# Patient Record
Sex: Female | Born: 1955
Health system: Southern US, Community
[De-identification: ages and names within clinical notes are randomized; demographics above are authoritative.]

## PROBLEM LIST (undated history)

## (undated) ENCOUNTER — Ambulatory Visit

## (undated) DIAGNOSIS — E785 Hyperlipidemia, unspecified: Secondary | ICD-10-CM

## (undated) DIAGNOSIS — K219 Gastro-esophageal reflux disease without esophagitis: Secondary | ICD-10-CM

## (undated) DIAGNOSIS — F419 Anxiety disorder, unspecified: Secondary | ICD-10-CM

## (undated) DIAGNOSIS — T7840XA Allergy, unspecified, initial encounter: Secondary | ICD-10-CM

## (undated) DIAGNOSIS — Q8789 Other specified congenital malformation syndromes, not elsewhere classified: Secondary | ICD-10-CM

## (undated) DIAGNOSIS — I10 Essential (primary) hypertension: Secondary | ICD-10-CM

## (undated) DIAGNOSIS — F32A Depression, unspecified: Secondary | ICD-10-CM

## (undated) DIAGNOSIS — N289 Disorder of kidney and ureter, unspecified: Secondary | ICD-10-CM

## (undated) DIAGNOSIS — M1990 Unspecified osteoarthritis, unspecified site: Secondary | ICD-10-CM

## (undated) DIAGNOSIS — F329 Major depressive disorder, single episode, unspecified: Secondary | ICD-10-CM

## (undated) HISTORY — DX: Hyperlipidemia, unspecified: E78.5

## (undated) HISTORY — PX: APPENDECTOMY: SHX54

## (undated) HISTORY — PX: URETERAL STENT PLACEMENT: SHX822

## (undated) HISTORY — PX: ABDOMINAL HYSTERECTOMY: SHX81

## (undated) HISTORY — DX: Unspecified osteoarthritis, unspecified site: M19.90

## (undated) HISTORY — DX: Essential (primary) hypertension: I10

## (undated) HISTORY — DX: Allergy, unspecified, initial encounter: T78.40XA

## (undated) HISTORY — PX: FOOT SURGERY: SHX648

---

## 1997-12-31 ENCOUNTER — Inpatient Hospital Stay (HOSPITAL_COMMUNITY): Admission: EM | Admit: 1997-12-31 | Discharge: 1998-01-06 | Payer: Self-pay | Admitting: *Deleted

## 1998-01-12 ENCOUNTER — Inpatient Hospital Stay (HOSPITAL_COMMUNITY): Admission: EM | Admit: 1998-01-12 | Discharge: 1998-01-20 | Payer: Self-pay | Admitting: Emergency Medicine

## 1998-01-12 ENCOUNTER — Ambulatory Visit: Admission: RE | Admit: 1998-01-12 | Discharge: 1998-01-12 | Payer: Self-pay | Admitting: Surgery

## 1998-01-12 ENCOUNTER — Encounter: Payer: Self-pay | Admitting: Surgery

## 1998-01-13 ENCOUNTER — Encounter: Payer: Self-pay | Admitting: Surgery

## 1998-01-15 ENCOUNTER — Encounter: Payer: Self-pay | Admitting: Surgery

## 1998-01-19 ENCOUNTER — Encounter: Payer: Self-pay | Admitting: Surgery

## 1999-05-16 ENCOUNTER — Emergency Department (HOSPITAL_COMMUNITY): Admission: EM | Admit: 1999-05-16 | Discharge: 1999-05-16 | Payer: Self-pay | Admitting: Emergency Medicine

## 1999-05-26 ENCOUNTER — Encounter: Admission: RE | Admit: 1999-05-26 | Discharge: 1999-05-26 | Payer: Self-pay | Admitting: Family Medicine

## 1999-05-26 ENCOUNTER — Encounter: Payer: Self-pay | Admitting: Family Medicine

## 1999-06-25 ENCOUNTER — Encounter (INDEPENDENT_AMBULATORY_CARE_PROVIDER_SITE_OTHER): Payer: Self-pay | Admitting: Specialist

## 1999-06-25 ENCOUNTER — Other Ambulatory Visit: Admission: RE | Admit: 1999-06-25 | Discharge: 1999-06-25 | Payer: Self-pay | Admitting: Otolaryngology

## 2002-07-27 ENCOUNTER — Emergency Department (HOSPITAL_COMMUNITY): Admission: EM | Admit: 2002-07-27 | Discharge: 2002-07-27 | Payer: Self-pay | Admitting: Emergency Medicine

## 2002-07-27 ENCOUNTER — Encounter: Payer: Self-pay | Admitting: Internal Medicine

## 2002-07-29 ENCOUNTER — Ambulatory Visit (HOSPITAL_COMMUNITY): Admission: RE | Admit: 2002-07-29 | Discharge: 2002-07-29 | Payer: Self-pay | Admitting: Internal Medicine

## 2002-07-29 ENCOUNTER — Encounter: Payer: Self-pay | Admitting: Internal Medicine

## 2002-08-26 ENCOUNTER — Inpatient Hospital Stay (HOSPITAL_COMMUNITY): Admission: RE | Admit: 2002-08-26 | Discharge: 2002-08-28 | Payer: Self-pay | Admitting: Obstetrics and Gynecology

## 2002-08-26 ENCOUNTER — Encounter (INDEPENDENT_AMBULATORY_CARE_PROVIDER_SITE_OTHER): Payer: Self-pay

## 2003-01-23 ENCOUNTER — Emergency Department (HOSPITAL_COMMUNITY): Admission: EM | Admit: 2003-01-23 | Discharge: 2003-01-23 | Payer: Self-pay | Admitting: Emergency Medicine

## 2005-08-03 ENCOUNTER — Encounter: Payer: Self-pay | Admitting: Surgery

## 2006-06-06 ENCOUNTER — Ambulatory Visit: Payer: Self-pay | Admitting: Psychiatry

## 2006-06-06 ENCOUNTER — Inpatient Hospital Stay (HOSPITAL_COMMUNITY): Admission: AD | Admit: 2006-06-06 | Discharge: 2006-06-07 | Payer: Self-pay | Admitting: Psychiatry

## 2008-07-20 ENCOUNTER — Emergency Department (HOSPITAL_COMMUNITY): Admission: EM | Admit: 2008-07-20 | Discharge: 2008-07-20 | Payer: Self-pay | Admitting: Emergency Medicine

## 2008-11-01 ENCOUNTER — Inpatient Hospital Stay (HOSPITAL_COMMUNITY): Admission: EM | Admit: 2008-11-01 | Discharge: 2008-11-02 | Payer: Self-pay | Admitting: Emergency Medicine

## 2008-11-26 ENCOUNTER — Observation Stay (HOSPITAL_COMMUNITY): Admission: RE | Admit: 2008-11-26 | Discharge: 2008-11-27 | Payer: Self-pay | Admitting: Urology

## 2009-09-18 ENCOUNTER — Emergency Department (HOSPITAL_COMMUNITY): Admission: EM | Admit: 2009-09-18 | Discharge: 2009-09-18 | Payer: Self-pay | Admitting: Emergency Medicine

## 2010-03-10 ENCOUNTER — Emergency Department (HOSPITAL_COMMUNITY)
Admission: EM | Admit: 2010-03-10 | Discharge: 2010-03-10 | Payer: Self-pay | Source: Home / Self Care | Admitting: Emergency Medicine

## 2010-07-10 LAB — HEMOGLOBIN AND HEMATOCRIT, BLOOD
HCT: 40.5 % (ref 36.0–46.0)
Hemoglobin: 14.1 g/dL (ref 12.0–15.0)

## 2010-07-11 LAB — CBC
HCT: 42.9 % (ref 36.0–46.0)
Hemoglobin: 14.7 g/dL (ref 12.0–15.0)
MCHC: 34.2 g/dL (ref 30.0–36.0)
MCV: 96.1 fL (ref 78.0–100.0)
Platelets: 233 10*3/uL (ref 150–400)
RBC: 4.46 MIL/uL (ref 3.87–5.11)
RDW: 13.7 % (ref 11.5–15.5)
WBC: 17.8 10*3/uL — ABNORMAL HIGH (ref 4.0–10.5)

## 2010-07-11 LAB — DIFFERENTIAL
Basophils Absolute: 0.1 10*3/uL (ref 0.0–0.1)
Basophils Relative: 0 % (ref 0–1)
Eosinophils Absolute: 0.2 10*3/uL (ref 0.0–0.7)
Eosinophils Relative: 1 % (ref 0–5)
Lymphocytes Relative: 8 % — ABNORMAL LOW (ref 12–46)
Lymphs Abs: 1.4 10*3/uL (ref 0.7–4.0)
Monocytes Absolute: 0.9 10*3/uL (ref 0.1–1.0)
Monocytes Relative: 5 % (ref 3–12)
Neutro Abs: 15.3 10*3/uL — ABNORMAL HIGH (ref 1.7–7.7)
Neutrophils Relative %: 86 % — ABNORMAL HIGH (ref 43–77)

## 2010-07-11 LAB — POCT I-STAT, CHEM 8
BUN: 7 mg/dL (ref 6–23)
Calcium, Ion: 1.16 mmol/L (ref 1.12–1.32)
Chloride: 104 mEq/L (ref 96–112)
Creatinine, Ser: 0.7 mg/dL (ref 0.4–1.2)
Glucose, Bld: 115 mg/dL — ABNORMAL HIGH (ref 70–99)
HCT: 44 % (ref 36.0–46.0)
Hemoglobin: 15 g/dL (ref 12.0–15.0)
Potassium: 3.4 mEq/L — ABNORMAL LOW (ref 3.5–5.1)
Sodium: 141 mEq/L (ref 135–145)
TCO2: 26 mmol/L (ref 0–100)

## 2010-07-11 LAB — URINALYSIS, ROUTINE W REFLEX MICROSCOPIC
Bilirubin Urine: NEGATIVE
Glucose, UA: NEGATIVE mg/dL
Ketones, ur: NEGATIVE mg/dL
Nitrite: NEGATIVE
Protein, ur: >300 mg/dL — AB
Specific Gravity, Urine: 1.009 (ref 1.005–1.030)
Urobilinogen, UA: 0.2 mg/dL (ref 0.0–1.0)
pH: 6.5 (ref 5.0–8.0)

## 2010-07-11 LAB — URINE MICROSCOPIC-ADD ON

## 2010-08-17 NOTE — H&P (Signed)
[name], [name]                  ACCOUNT NO.:  [id number]   MEDICAL RECORD NO.:  [id number]          PATIENT TYPE:  INP   LOCATION:  1442                         FACILITY:  [facility]   PHYSICIAN:  Bertram Millard. Dahlstedt, M.D.DATE OF BIRTH:  Aug 28, 1955   DATE OF ADMISSION:  11/01/2008  DATE OF DISCHARGE:                              HISTORY & PHYSICAL   CHIEF COMPLAINT:  Left kidney stone.   BRIEF HISTORY:  A 55 year old female without prior urologic history,  presented to the emergency room early in the morning with severe left  flank pain, nausea, vomiting and fever.  She has been having  intermittent left-sided back pain for the past 2 weeks.  She has had  recurring urinary tract infections for the past 2 months or so.  She did  not have any severe symptoms until a few hours ago when this left flank  pain and left lower quadrant pain hit.  This was very severe and she  presented to the emergency room here at [facility].  Evaluation  revealed pyuria.  CT abdomen and pelvis revealed an obstructing left mid  ureteral stone 11 mm in size.  Due to the infectious nature of the  patient's process,  urologic consultation is requested.  She denies  prior kidney stones.  She has not had a longstanding history of urinary  tract infections, just over the past few months.   PAST MEDICAL HISTORY:  1. Significant for 2 prior vaginal deliveries.  2. She has had a hysterectomy and appendectomy.  3. She has some bursitis in her left shoulder and elbow.   MEDICATIONS:  She is not currently on any regular medications.   ALLERGIES:  1. MORPHINE which causes an itch.  2. NONSTEROIDALS which causes a rash.   SOCIAL HISTORY:  She is married and has 2 children.  She uses tobacco.  She lives in [place]. She is unemployed.   FAMILY HISTORY:  Negative for urologic issues.   REVIEW OF SYSTEMS:  Significant for recent fever, nausea, vomiting.  She  has had dysuria as well.  She denies any chest  pain, cough or shortness  of breath.   PHYSICAL EXAMINATION:  GENERAL:  Exam revealed a pleasant middle-aged  female.  VITAL SIGNS:  Blood pressure 146/98, temperature 101.1, pulse 90.  NECK:  Supple.  HEENT:  There is no scleral icterus.  Mucous membranes were dry.  LUNGS:  Clear bilaterally.  HEART:  Normal rate and rhythm.  ABDOMEN:  Flat, soft, nondistended with moderate left lower quadrant,  left upper quadrant and left CVA tenderness.  Bowel sounds were present.  SKIN:  Warm and dry.  EXTREMITIES:  There is no peripheral edema.  NEUROLOGIC:  She is alert, oriented.  Mental status and affect are  normal.   LABORATORY:  White count of 18,000, potassium 3.4.  BUN and creatinine  were normal.   DIAGNOSTICS:  CT urogram was reviewed independently and reveals  significant left hydroureter down to  an 11 mm left mid ureteral stone.   IMPRESSION:  Obstructing left ureteral stone with hydronephrosis and  pyelonephritis.   PLAN:  1. At this point, I feel it is worthwhile to proceed with drainage of      the left kidney.  We will take her urgently to the operating room      where we will place a stent cystoscopically.  2. She will be continued on antibiotics and once she has cooled down      in a matter of a couple of weeks, we will then proceed with      treatment of her kidney stone.      Bertram Millard. Dahlstedt, M.D.  Electronically Signed     SMD/MEDQ  D:  11/01/2008  T:  11/01/2008  Job:  [id number]   cc:   Windle Guard, M.D.  Fax: [phone]

## 2010-08-17 NOTE — Op Note (Signed)
[name], [name]                  ACCOUNT NO.:  [id number]   MEDICAL RECORD NO.:  [id number]          PATIENT TYPE:  INP   LOCATION:  1442                         FACILITY:  [facility]   PHYSICIAN:  Bertram Millard. Dahlstedt, M.D.DATE OF BIRTH:  1955/05/21   DATE OF PROCEDURE:  11/01/2008  DATE OF DISCHARGE:                               OPERATIVE REPORT   PREOPERATIVE DIAGNOSIS:  Left ureteral stone, 11 mm with high-grade  obstruction and pyelonephritis.   POSTOPERATIVE DIAGNOSIS:  Left ureteral stone, 11 mm with high-grade  obstruction and pyelonephritis.   PRINCIPAL PROCEDURE:  Cystoscopy, left double-J stent placement (24-cm x  7-French Contour).   SURGEON:  Dr. Retta Diones.   ANESTHESIA:  General endotracheal.   COMPLICATIONS:  None.   BRIEF HISTORY:  A 55 year old female presenting a few hours ago to the  emergency room with left flank pain.  Evaluation revealed a urinary  tract infection and a high-grade obstruction of her left ureter  secondary to an 11-mm ureteral stone.  She was admitted, placed on  antibiotics, and presented at this time for decompression of her left  kidney with a stent.  Risks and complications of the procedure have been  discussed with the patient.  She understands these and desires to  proceed.   DESCRIPTION OF PROCEDURE:  The patient was identified in the holding  area.  The surgical site had been marked.  She received preoperative IV  antibiotics.  She was taken to the operating room where general  anesthetic was administered.  She was placed in the dorsal lithotomy  position.  Genitalia and perineum were prepped and draped.  Time out was  then called.   The procedure then commenced.  A 22-French panendoscope was passed into  her bladder which was normal.  Both ureteral orifices were normal.  Left  ureteral orifice was cannulated with a sensor-tip guidewire.  It was  quite difficult to navigate by the stone.  However, once it was  navigated by the  stone and up into the renal pelvis, a 6-French open-  ended catheter was placed over top of the guidewire.  I was going to  squirt a retrograde to confirm positioning of the wire and catheter.  However, there was a hydronephrotic drip, and I knew the catheter was  within the ureter and not extra-ureteral.  At this point, the guidewire  was replaced and catheter removed.  A 24-cm x 7-French contour stent  with the string off was then placed into the ureter with a proximal end  of the renal pelvis and the distal end bladder.  Good curls were seen  proximally and distally after the guidewire was removed.  The bladder  was drained and then the procedure terminated.  The patient was  awakened.      Bertram Millard. Dahlstedt, M.D.  Electronically Signed     SMD/MEDQ  D:  11/01/2008  T:  11/01/2008  Job:  [id number]   cc:   Windle Guard, M.D.  Fax: [phone]

## 2010-08-17 NOTE — Op Note (Signed)
[name], [name]                  ACCOUNT NO.:  [id number]   MEDICAL RECORD NO.:  [id number]          PATIENT TYPE:  AMB   LOCATION:  DAY                          FACILITY:  [facility]   PHYSICIAN:  Bertram Millard. Dahlstedt, M.D.DATE OF BIRTH:  Jun 26, 1955   DATE OF PROCEDURE:  11/26/2008  DATE OF DISCHARGE:                               OPERATIVE REPORT   PREOPERATIVE DIAGNOSIS:  Left mid ureteral stone.   POSTOPERATIVE DIAGNOSIS:  Left mid ureteral stone.   PRINCIPAL PROCEDURE:  Cystoscopy, left double-J stent extraction,  ureteroscopy with holmium laser and extraction of left ureteral stone.   SURGEON:  Dr. Retta Diones.   ANESTHESIA:  General with LMA.   COMPLICATIONS:  None.   BRIEF HISTORY:  A 55 year old female with an obstructing left ureteral  stone.  She has had a stent for approximately 2 weeks, it was placed  initially when she presented with left flank pain and pyuria, as well as  fever.  She presents at this time for definitive management of the  stone.  We have discussed lithotripsy versus ureteroscopy and laser  lithotripsy.  I have recommended the latter, it is more likely to make  her stone free with one treatment.  The risks and complications of the  procedure were discussed with the patient.  She understands these and  desires to proceed.   DESCRIPTION OF PROCEDURE:  The patient was identified in the holding  area and received preoperative IV antibiotics.  She was taken to the  operating room where general anesthetic was administered using the LMA.  She was placed in the dorsal lithotomy position.  The genitalia and  perineum were prepped and draped.  A timeout was then called.   A cystoscope was then placed with the bladder visualized.  It was normal  except for a left ureteral stent which was removed.  A guidewire was  then placed into the left kidney through the ureter using fluoroscopic  guidance.  The rigid ureteroscope was then placed up the ureter.  The  stone was somewhat impacted in the anterior ureter in the mid-aspect of  the ureter.  It was dug out of the urothelium, and pushed more  proximally within the ureter where it could easily be fragmented.  I  then placed a 365 micron guidewire and fragmented the stone into  multiple small fragments and three larger fragments which were  extracted.  The smaller fragments were sand size, and it was felt that  these would pass without difficulty.  Once all large fragments were  removed and saved, the ureter was again inspected and no large fragments  were seen.  No stent was placed.  The bladder was drained and the  procedure terminated.   The patient tolerated the procedure well.  She was awakened and taken to  the PACU in stable condition.   She will follow-up in approximately 1 week.  She was sent home on  Vesicare and Cipro 250 mg 1 p.o. b.i.d., as well as pain medicine.      Bertram Millard. Dahlstedt, M.D.  Electronically Signed  SMD/MEDQ  D:  11/26/2008  T:  11/26/2008  Job:  [id number]

## 2010-08-20 NOTE — Op Note (Signed)
NAME:  [name], [name]                            ACCOUNT NO.:  [id number]   MEDICAL RECORD NO.:  [id number]                   PATIENT TYPE:  INP   LOCATION:  9198                                 FACILITY:  [facility]   PHYSICIAN:  Lenoard Aden, M.D.             DATE OF BIRTH:  01/17/1956   DATE OF PROCEDURE:  08/26/2002  DATE OF DISCHARGE:                                 OPERATIVE REPORT   PREOPERATIVE DIAGNOSIS:  Pelvic pain, dysmenorrhea.   POSTOPERATIVE DIAGNOSES:  1. Pelvic pain, dysmenorrhea.  2. Uterine fibroids.  3. Left ovarian cyst.  4. Pelvic adhesions.   PROCEDURE:  Exploratory laparotomy, TAH, left ovarian cystectomy, and lysis  of adhesions.   SURGEON:  Lenoard Aden, M.D.   ASSISTANT:  Pershing Cox, M.D.   ESTIMATED BLOOD LOSS:  400 mL.   COMPLICATIONS:  None.   DRAINS:  Foley.   COUNTS:  Correct.   DISPOSITION:  The patient to recovery in good condition.   BRIEF OPERATIVE NOTE:  After being apprised of the risks of anesthesia,  infection, bleeding, injury to abdominal organs with the need for repair,  delayed versus immediate complications to include bowel and bladder injury  noted, the patient acknowledges and wishes to proceed.  Inability to cure  pain discussed.  The patient was brought to the operating and administered  general anesthetic without complications.  Prepped and draped in the usual  sterile fashion; Foley catheter placed.  The area of the previous  Pfannenstiel skin incision is incised with the scalpel after placement of 10  mL of a dilute Marcaine incision.  The fascia is then nicked in the midline  and opened transversely using Mayo scissors.  The rectus muscles dissected  were sharply in the midline, the peritoneum entered sharply, bladder blade  placed, and Balfour retractor placed.  Visualization reveals adhesions of  the left sigmoid colon to the left uterine cornua and previous absence of  the left tube.  The adhesions are  lysed sharply close to the uterus with no  damage to the bowel noted.  The tuboovarian ligament is then clamped and  ligated using the LigaSure on the right side.  The round ligament is  identified, ligated using the LigaSure.  The tuboovarian ligament is  LigaSured and then ligated as well.  Uterine vessels are skeletonized  bilaterally and divided then desiccated using the LigaSure.  Bladder flap  was developed sharply off of the lower uterine segment and a right lower  uterine segment fibroid is noted in the process.  The uterine vessels are  bilaterally divided using the LigaSure and partial specimen is removed.  Uterosacral ligaments are then grasped and then ligated and then held.  The  specimen is removed.  The vagina is closed using interrupted 0 Vicryl  sutures.  The uterosacrals are transfixed to the lateral walls of the cuff  and secured.  Good hemostasis is noted; irrigation is accomplished.  A large  corpus luteum cyst is excised from the left ovary and the cyst cavity is  oversewn using 0 Vicryl mattress sutures.  The right ovary appears to be  within normal limits.  The right tube appears to be within normal limits.  Good hemostasis is achieved.  Bleeding along the left uterine vessel is  secured using a right angle and 0 Vicryl stitch.  At this time irrigation is  once again accomplished.  Good hemostasis is noted.  Ureters are identified  bilaterally and found to be palpably within normal limits.  All packs are  removed from the abdomen and retractors removed; good hemostasis is noted.  The fascia is closed using 0 Vicryl in continuous running fashion, the skin  closed with skin staples.  The patient tolerates the procedure well and is  transferred to recovery in good condition.                                               Lenoard Aden, M.D.    RJT/MEDQ  D:  08/26/2002  T:  08/26/2002  Job:  [id number]

## 2010-08-20 NOTE — Discharge Summary (Signed)
NAMEMARIETTA, [name]                  ACCOUNT NO.:  [id number]   MEDICAL RECORD NO.:  [id number]          PATIENT TYPE:  IPS   LOCATION:  0304                          FACILITY:  [facility]   PHYSICIAN:  Anselm Jungling, MD  DATE OF BIRTH:  Feb 21, 1956   DATE OF ADMISSION:  06/06/2006  DATE OF DISCHARGE:  06/07/2006                               DISCHARGE SUMMARY   IDENTIFYING DATA AND REASON FOR ADMISSION:  This was a brief inpatient  psychiatric admission for [name], a 55 year old married woman who  presented with increased depression, crying spells, and suicidal  thoughts without plan or intent.  She had been involved in outpatient  treatment, and had recently begun a trial of Celexa 20 mg daily.  She  had previously had a trial of Wellbutrin for depression, but did not  tolerate it well.   The patient presented as a well-nourished, well-developed and well-  organized individual who was pleasant and cooperative.  She denied  suicidal ideation in a way that would appear to be very sincere and  convincing.  She was initially interviewed by the undersigned on the  morning following her admission the night before.  She indicated that  she felt that she had made a mistake in the setting to come to the  inpatient setting, and felt that she needed outpatient consultation and  reevaluation of her psychotropic medication regimen.  She requested  discharge.   [name] was apparently in concurrence with the patient's  desire for discharge to the outpatient setting.  Because of this, it did  appear appropriate for the patient to be discharged, per the wishes of  she and her husband.   She agreed to an aftercare plan involving an appointment on the same day  with [facility], to see Shaaron Adler at 2:45 in the  afternoon.  She was discharged with a recommendation to continue Celexa  20 mg daily.   DISCHARGE DIAGNOSES:  AXIS I: Major depressive disorder, recurrent  without  psychotic features.  AXIS II: Deferred.  AXIS III: No acute medical conditions.  AXIS IV: Stressors severe.  AXIS V: Global assessment of functioning on discharge 65.      Anselm Jungling, MD  Electronically Signed     SPB/MEDQ  D:  06/08/2006  T:  06/08/2006  Job:  [id number]

## 2010-08-20 NOTE — Discharge Summary (Signed)
   NAME:  [name], [name]                            ACCOUNT NO.:  [id number]   MEDICAL RECORD NO.:  [id number]                   PATIENT TYPE:  INP   LOCATION:  9138                                 FACILITY:  [facility]   PHYSICIAN:  Lenoard Aden, M.D.             DATE OF BIRTH:  Aug 03, 1955   DATE OF ADMISSION:  08/26/2002  DATE OF DISCHARGE:  08/28/2002                                 DISCHARGE SUMMARY   SUMMARY:  The patient underwent uncomplicated exploratory laparotomy, TAH,  left ovarian cystectomy, lysis of adhesions on Aug 26, 2002.  Postoperative  course uncomplicated, tolerated a regular diet well.  Discharged to home  postoperative day #2.  Tylox and Motrin given for pain.  Follow up in the  office in three to four weeks.  Discharge teaching done.                                               Lenoard Aden, M.D.    RJT/MEDQ  D:  09/18/2002  T:  09/18/2002  Job:  [id number]

## 2010-08-20 NOTE — H&P (Signed)
NAME:  [name], [name]                            ACCOUNT NO.:  [id number]   MEDICAL RECORD NO.:  [id number]                   PATIENT TYPE:  INP   LOCATION:  NA                                   FACILITY:  [facility]   PHYSICIAN:  Lenoard Aden, M.D.             DATE OF BIRTH:  [birth year]   DATE OF ADMISSION:  08/25/2002  DATE OF DISCHARGE:                                HISTORY & PHYSICAL   CHIEF COMPLAINT:  Symptomatic dysmenorrhea with fibroids and right lower  quadrant pain.   HISTORY OF PRESENT ILLNESS:  A 55 year old white female, G1, P1, with a  history of pelvic pain and pelvic adhesive disease, status post diagnostic  laparoscopy, diagnostic hysteroscopy, lysis of adhesions, exploratory  laparotomy, and left salpingectomy in 1999, who presents with recurrent pain  and discomfort.  Pelvic ultrasound confirms multiple uterine fibroids and  bilateral normal-appearing ovaries.  She decides to proceed with definitive  management.   PAST MEDICAL HISTORY:  Remarkable for:  1. Laser surgery of the cervix in 1984.  2. One uncomplicated vaginal delivery.  3. History of breast lumps in the 1980s.  4. History of PID in 1998.  5. History of kidney infection in 1998.  6. Panic attacks.   PAST SURGICAL HISTORY:  Remarkable for:  1. Sinus surgery.  2. Appendectomy.   ALLERGIES:  1. ASPIRIN.  2. PENICILLIN.  3. Various food allergies.   MEDICATIONS:  Medications previously were:  1. Paxil.  2. Allegra.  3. Cipro.   SOCIAL HISTORY:  She is a half a pack a day smoker.  She denies domestic or  physical violence.   PHYSICAL EXAMINATION:  GENERAL APPEARANCE:  She is a well-developed, well-  nourished, white female in no acute distress.  HEENT:  Normal.  LUNGS:  Clear.  HEART:  Regular rate and rhythm.  ABDOMEN:  Soft and nontender.  The uterus is small and anteflexed.  No  adnexal masses.  Right adnexal tenderness is appreciated.  The uterus is 8-  10 weeks size.  EXTREMITIES:   No cords.  NEUROLOGIC:  Nonfocal.   IMPRESSION:  Symptomatic dysmenorrhea and menorrhagia with know uterine  fibroids and pelvic adhesive disease for definitive therapy.   PLAN:  Proceed with TAH, possible BSO versus ovarian conservation, and lysis  of adhesions.  The risks of anesthesia, infection, bleeding, injury to  abdominal organs, and need for repair were discussed.  Delayed versus  immediate complications to include bowel and bladder injury were noted.  The  patient acknowledges and wishes to proceed.                                               Lenoard Aden, M.D.    RJT/MEDQ  D:  08/25/2002  T:  08/25/2002  Job:  [id number]

## 2010-09-01 IMAGING — CR DG CHEST 2V
2 series · 2 of 2 positions shown · non-contrast
Comparison: None

CLINICAL DATA: Preop for kidney stone

CHEST - 2 VIEW

[w chest pa]
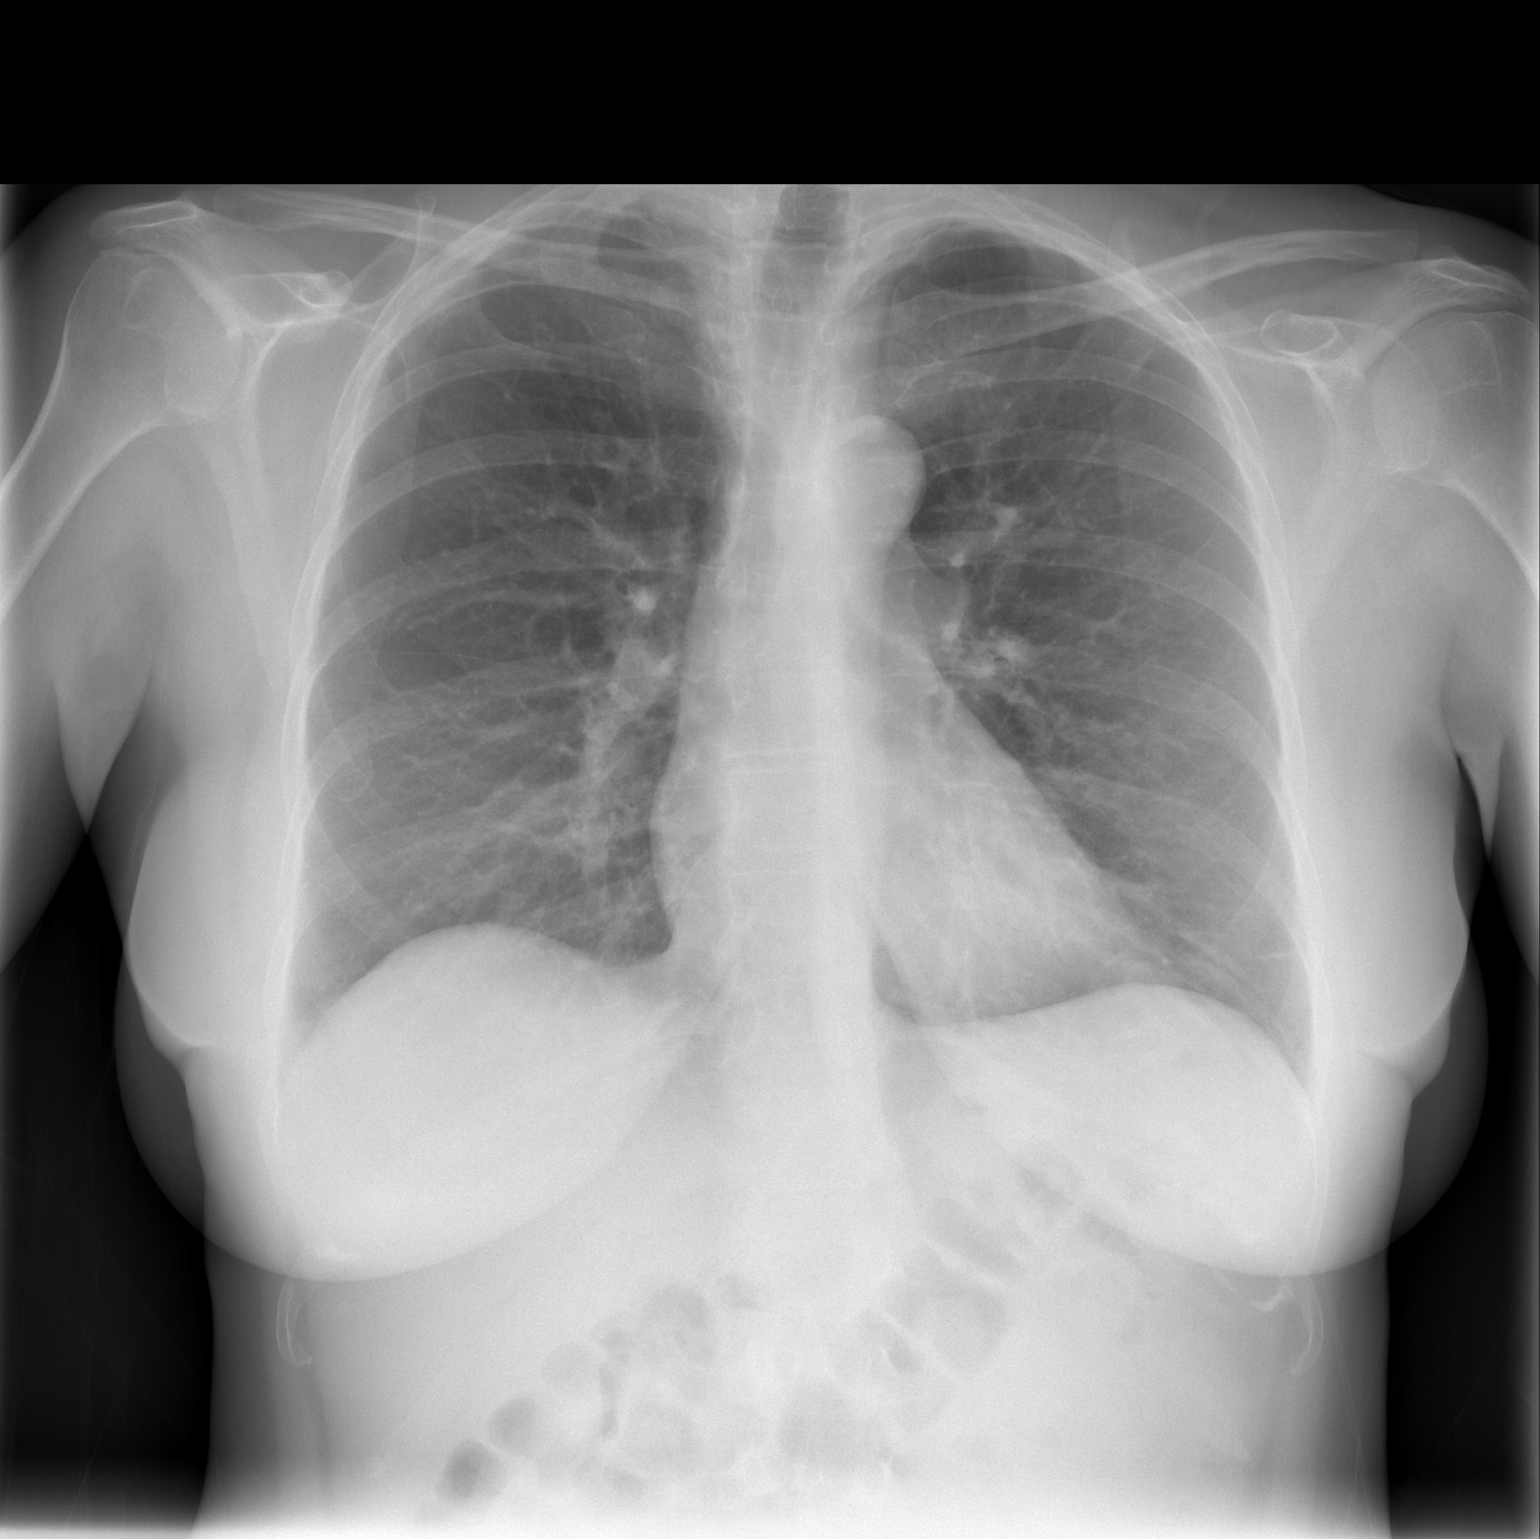

[w chest lat]
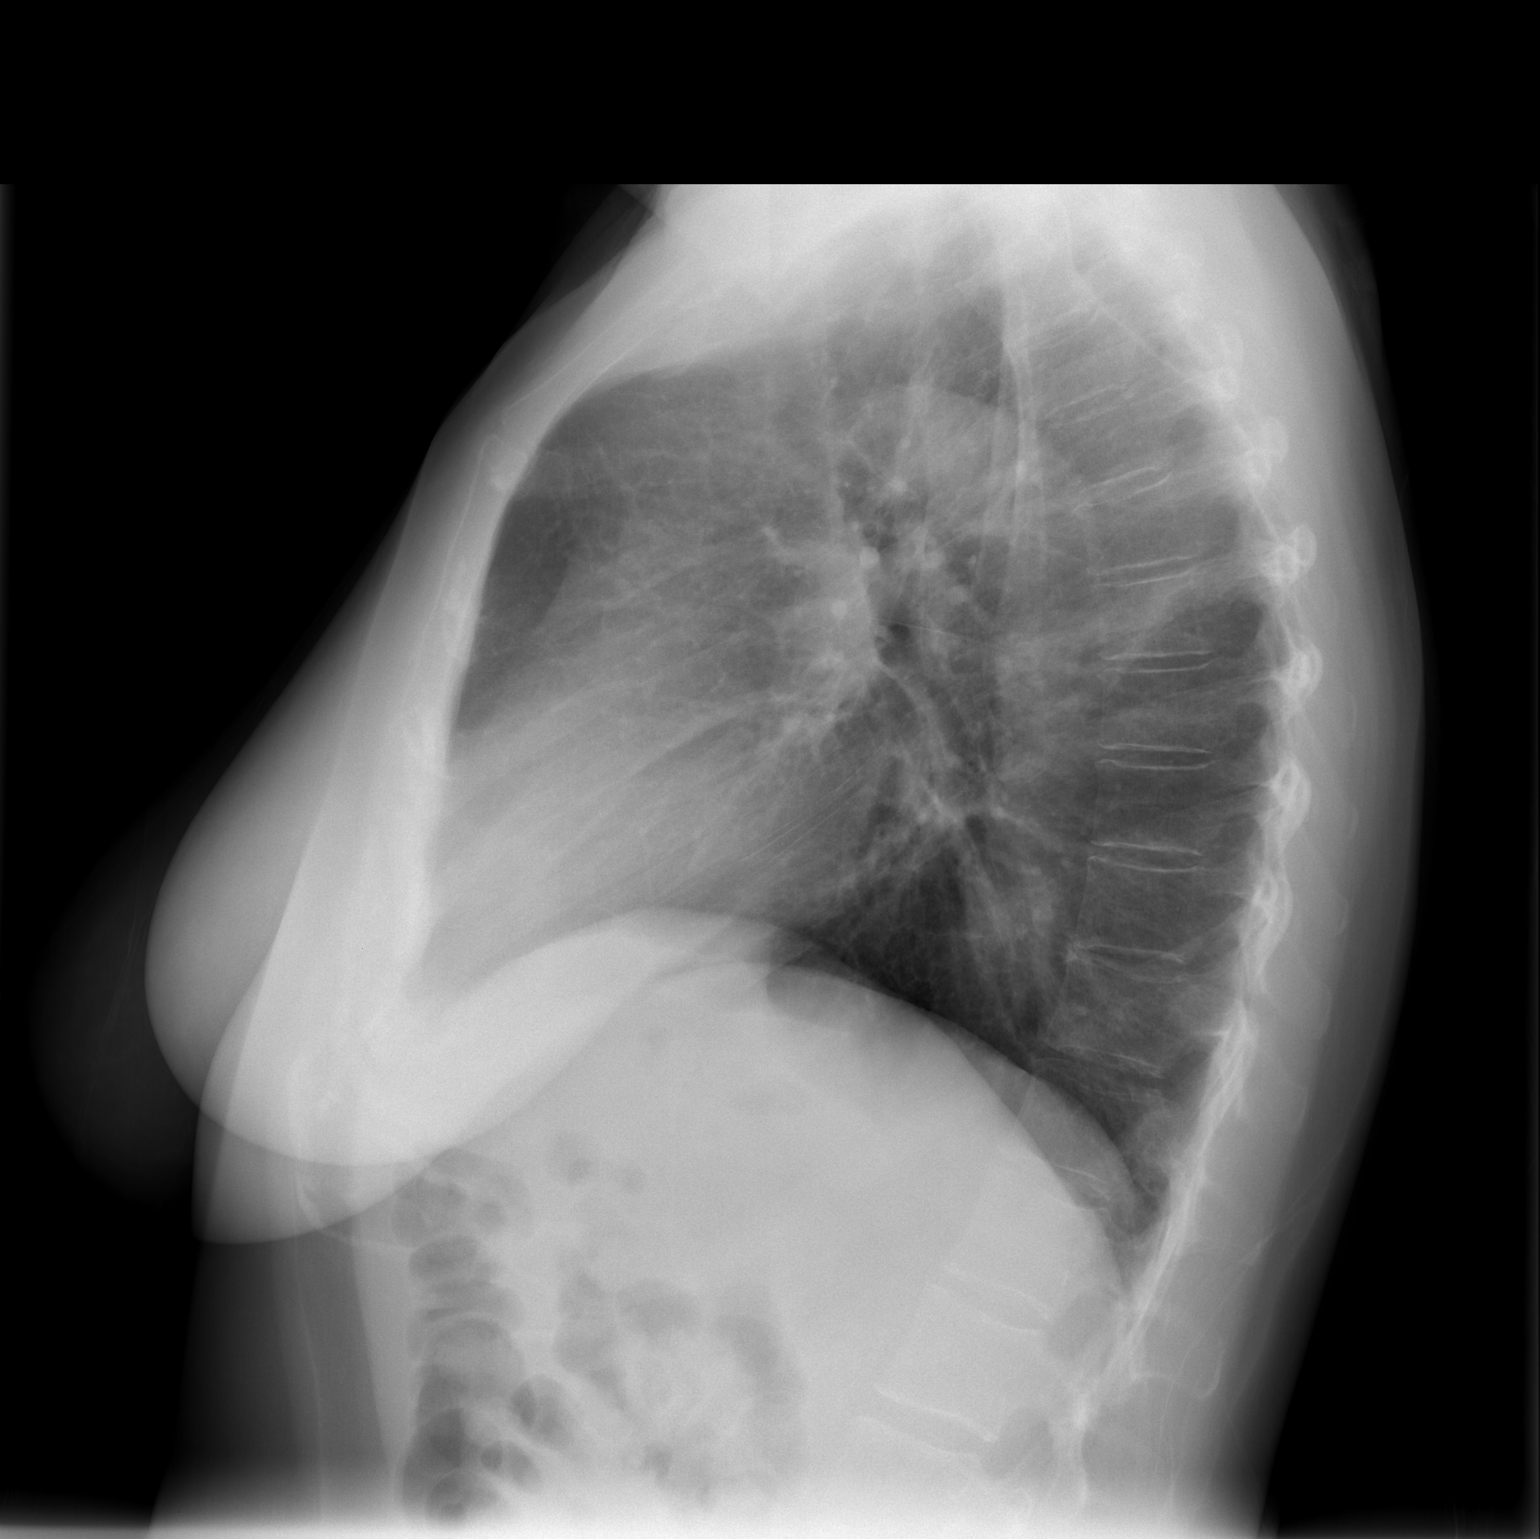

[2 of 2 positions shown; findings below may reference images not displayed]

FINDINGS: The lungs are clear.  Mild peribronchial thickening is
noted.  The heart is within normal limits in size.  No bony
abnormality is seen.
IMPRESSION: No active lung disease.  Mild peribronchial thickening.

## 2010-09-01 IMAGING — CT CT ABDOMEN W/O CM
2 of 4 series · 17 of 46 positions shown, 19 images · non-contrast
Comparison: None

CT ABDOMEN

CLINICAL DATA: Left flank pain.

CT ABDOMEN AND PELVIS WITHOUT CONTRAST
TECHNIQUE: Multidetector CT imaging of the abdomen and pelvis was
performed following the standard protocol without intravenous
contrast.

[Series 2: stone w/o 5.0 b40f · axial · non-contrast · 0.67mm/px · z∈[-441,-53]mm · 14 of 107 slices shown, 16 images]
[im 5/107  soft-tissue]
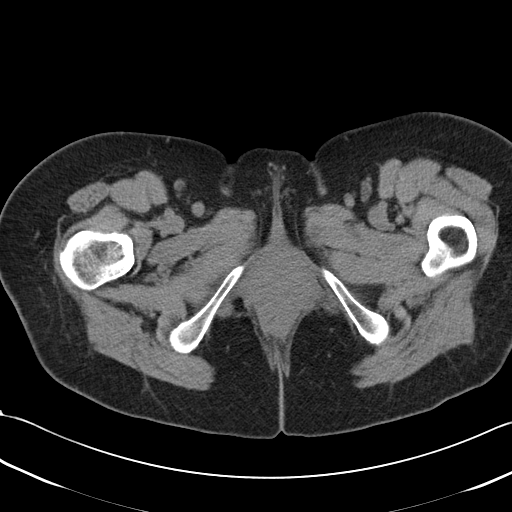
[im 5/107  bone]
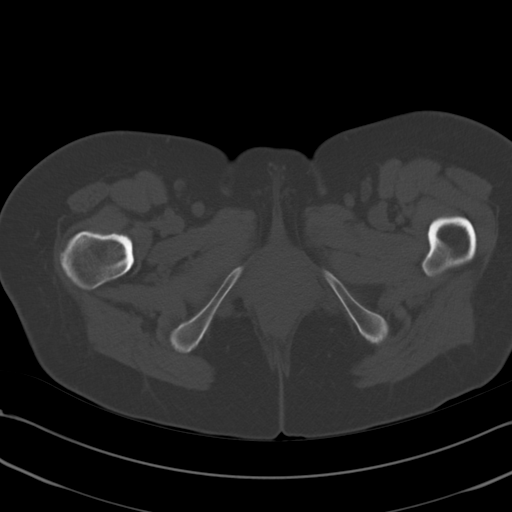
[im 13/107  soft-tissue]
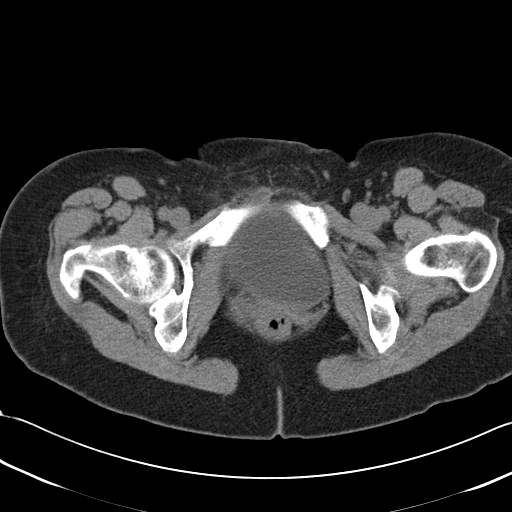
[im 21/107  soft-tissue]
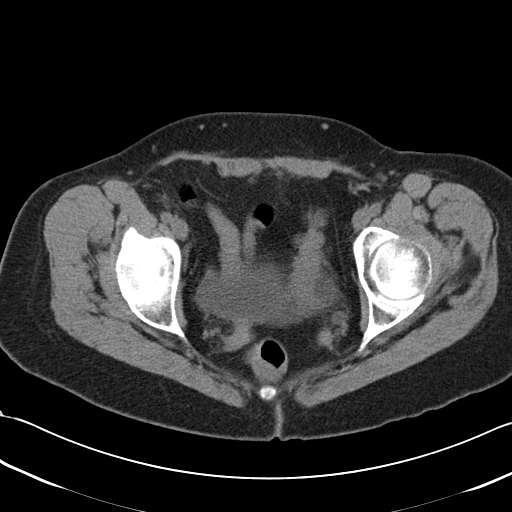
[im 29/107  soft-tissue]
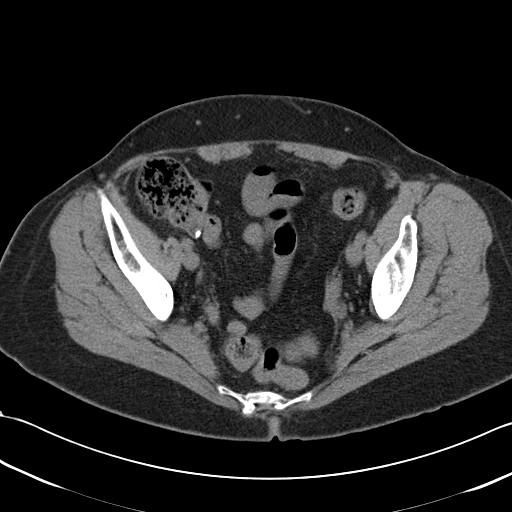
[im 37/107  soft-tissue]
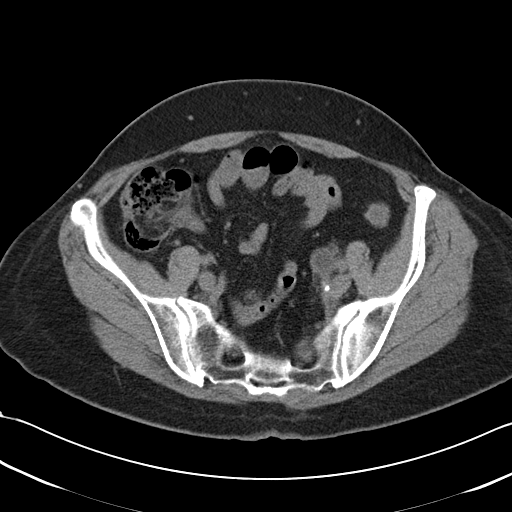
[im 41/107  soft-tissue]
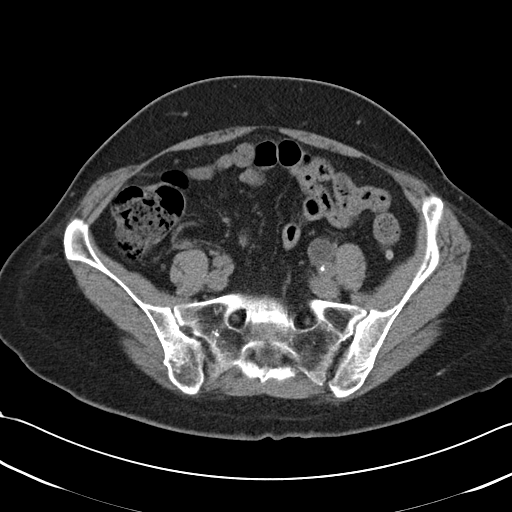
[im 49/107  soft-tissue]
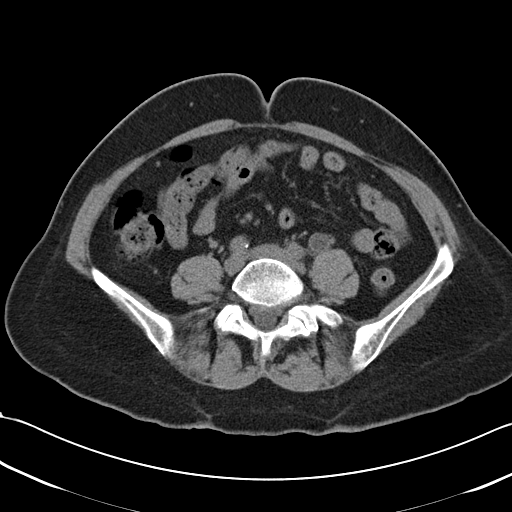
[im 58/107  soft-tissue]
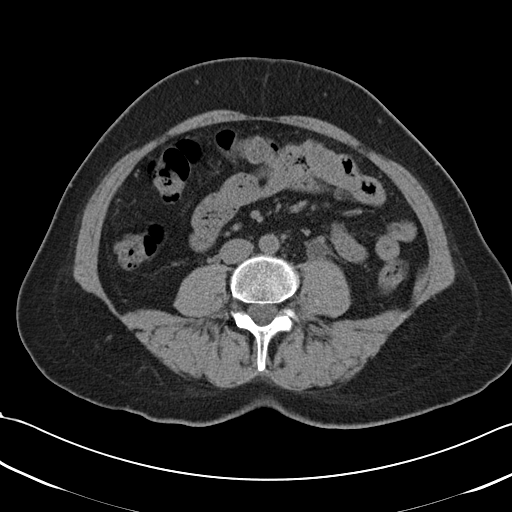
[im 66/107  soft-tissue]
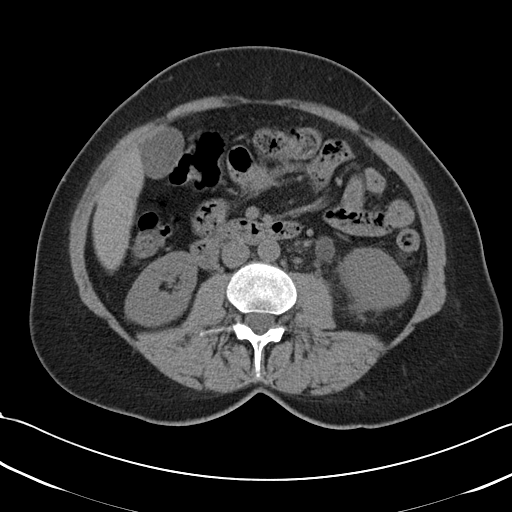
[im 66/107  bone]
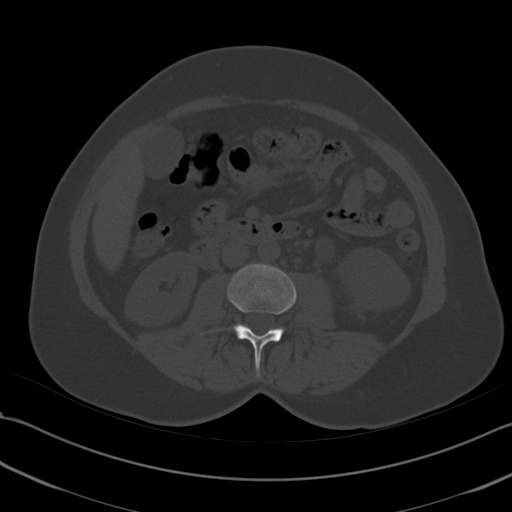
[im 70/107  soft-tissue]
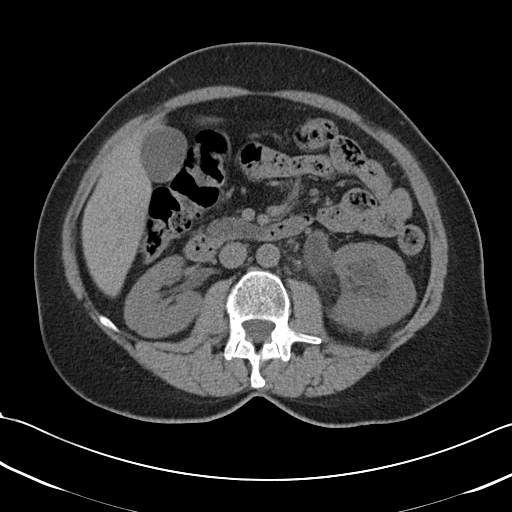
[im 78/107  soft-tissue]
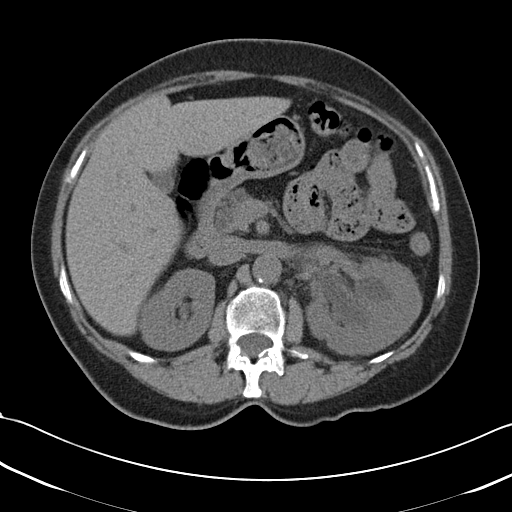
[im 86/107  soft-tissue]
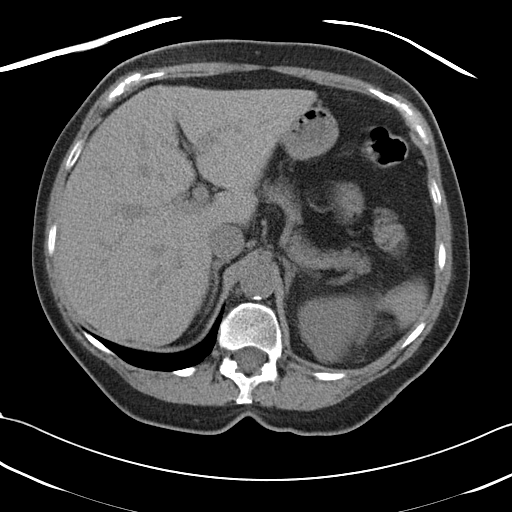
[im 94/107  soft-tissue]
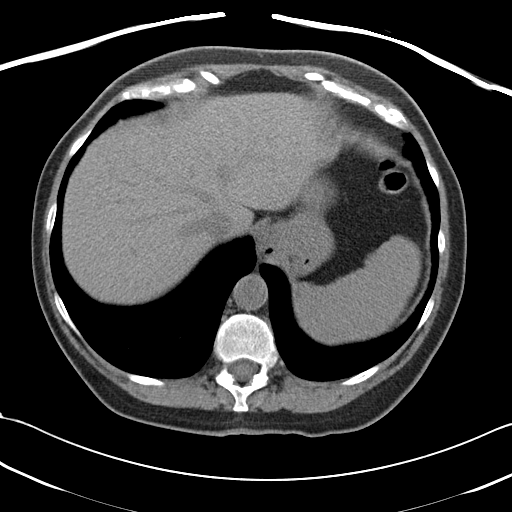
[im 102/107  soft-tissue]
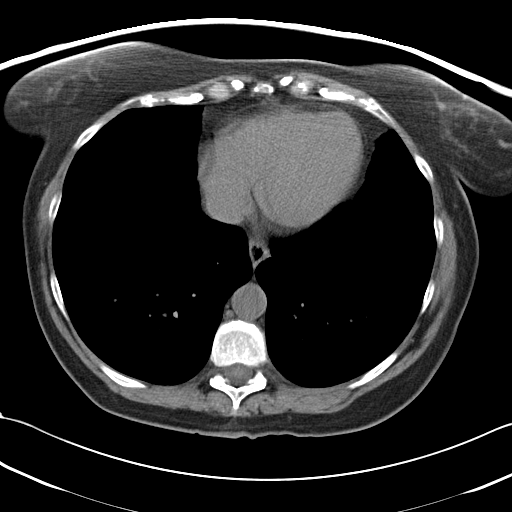

[Series 602: coronal abdomen · coronal · 0.84mm/px · 3 of 118 slices shown]
[im 40/118  soft-tissue]
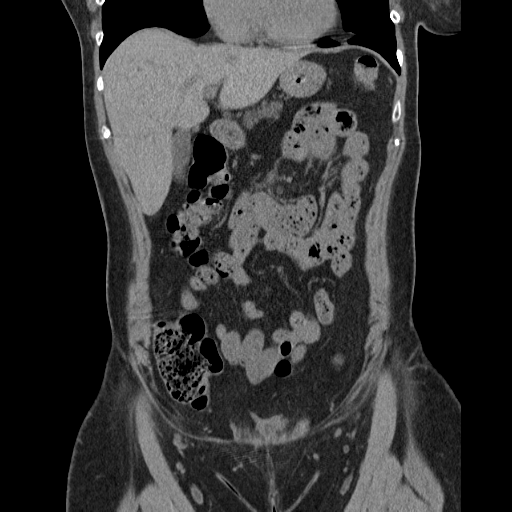
[im 53/118  soft-tissue]
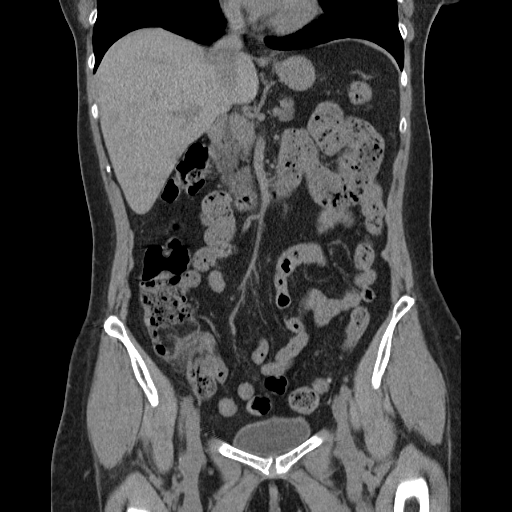
[im 66/118  soft-tissue]
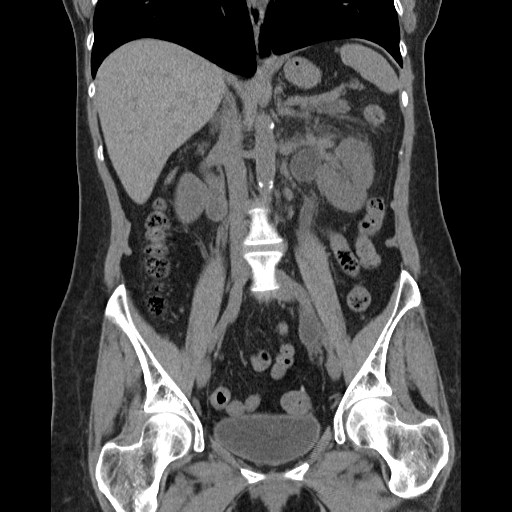

[17 of 46 positions shown; findings below may reference images not displayed]

FINDINGS: Lung bases are clear.  No effusions.  Heart is normal
size.

There is severe left hydronephrosis and hydroureter with
perinephric stranding.  Small nonobstructing stones in the lower
pole of the left kidney.  No proximal ureteral stones.  No stones
or hydronephrosis on the right.

Otherwise solid organs have an unremarkable unenhanced appearance.
Gallbladder and bowel unremarkable.  No free fluid, free air, or
adenopathy.
IMPRESSION: Severe left hydronephrosis and hydroureter.

CT PELVIS
FINDINGS: Left ureter is dilated to an 11 mm stone in the mid to
distal left ureter just beyond the pelvic brim.  No additional
ureteral stones.  Bladder unremarkable.

Scattered sigmoid diverticula noted.  No evidence of active
diverticulitis.  Pelvic small bowel unremarkable.  No free fluid,
free air, or adenopathy.
IMPRESSION: 11 mm left ureteral stone just beyond the pelvic brim.

## 2010-09-23 ENCOUNTER — Emergency Department (HOSPITAL_COMMUNITY)
Admission: EM | Admit: 2010-09-23 | Discharge: 2010-09-23 | Disposition: A | Discharge status: Home / Self Care | Payer: Self-pay | Attending: Emergency Medicine | Admitting: Emergency Medicine

## 2010-09-23 ENCOUNTER — Emergency Department (HOSPITAL_COMMUNITY): Payer: Self-pay

## 2010-09-23 DIAGNOSIS — R42 Dizziness and giddiness: Secondary | ICD-10-CM | POA: Insufficient documentation

## 2010-09-23 DIAGNOSIS — M25539 Pain in unspecified wrist: Secondary | ICD-10-CM | POA: Insufficient documentation

## 2010-09-23 DIAGNOSIS — S62123A Displaced fracture of lunate [semilunar], unspecified wrist, initial encounter for closed fracture: Secondary | ICD-10-CM | POA: Insufficient documentation

## 2010-09-23 DIAGNOSIS — R296 Repeated falls: Secondary | ICD-10-CM | POA: Insufficient documentation

## 2010-09-23 DIAGNOSIS — R55 Syncope and collapse: Secondary | ICD-10-CM | POA: Insufficient documentation

## 2010-09-23 LAB — GLUCOSE, CAPILLARY: Glucose-Capillary: 98 mg/dL (ref 70–99)

## 2010-12-20 ENCOUNTER — Emergency Department (HOSPITAL_COMMUNITY)
Admission: EM | Admit: 2010-12-20 | Discharge: 2010-12-20 | Disposition: A | Discharge status: Home / Self Care | Payer: BC Managed Care – PPO | Attending: Emergency Medicine | Admitting: Emergency Medicine

## 2010-12-20 ENCOUNTER — Emergency Department (HOSPITAL_COMMUNITY): Payer: BC Managed Care – PPO

## 2010-12-20 DIAGNOSIS — R55 Syncope and collapse: Secondary | ICD-10-CM | POA: Insufficient documentation

## 2010-12-20 DIAGNOSIS — R51 Headache: Secondary | ICD-10-CM | POA: Insufficient documentation

## 2010-12-20 DIAGNOSIS — M542 Cervicalgia: Secondary | ICD-10-CM | POA: Insufficient documentation

## 2010-12-20 DIAGNOSIS — R296 Repeated falls: Secondary | ICD-10-CM | POA: Insufficient documentation

## 2010-12-20 DIAGNOSIS — Y9229 Other specified public building as the place of occurrence of the external cause: Secondary | ICD-10-CM | POA: Insufficient documentation

## 2010-12-20 DIAGNOSIS — S0510XA Contusion of eyeball and orbital tissues, unspecified eye, initial encounter: Secondary | ICD-10-CM | POA: Insufficient documentation

## 2010-12-20 LAB — CBC
HCT: 42.2 % (ref 36.0–46.0)
Hemoglobin: 14.3 g/dL (ref 12.0–15.0)
MCH: 29.9 pg (ref 26.0–34.0)
MCHC: 33.9 g/dL (ref 30.0–36.0)
MCV: 88.1 fL (ref 78.0–100.0)
Platelets: 238 10*3/uL (ref 150–400)
RBC: 4.79 MIL/uL (ref 3.87–5.11)
RDW: 13.5 % (ref 11.5–15.5)
WBC: 14.2 10*3/uL — ABNORMAL HIGH (ref 4.0–10.5)

## 2010-12-20 LAB — DIFFERENTIAL
Basophils Absolute: 0 10*3/uL (ref 0.0–0.1)
Basophils Relative: 0 % (ref 0–1)
Eosinophils Absolute: 0.3 10*3/uL (ref 0.0–0.7)
Eosinophils Relative: 2 % (ref 0–5)
Lymphocytes Relative: 19 % (ref 12–46)
Lymphs Abs: 2.7 10*3/uL (ref 0.7–4.0)
Monocytes Absolute: 0.7 10*3/uL (ref 0.1–1.0)
Monocytes Relative: 5 % (ref 3–12)
Neutro Abs: 10.5 10*3/uL — ABNORMAL HIGH (ref 1.7–7.7)
Neutrophils Relative %: 74 % (ref 43–77)

## 2010-12-20 LAB — BASIC METABOLIC PANEL
BUN: 11 mg/dL (ref 6–23)
CO2: 26 mEq/L (ref 19–32)
Calcium: 9.5 mg/dL (ref 8.4–10.5)
Chloride: 100 mEq/L (ref 96–112)
Creatinine, Ser: 0.61 mg/dL (ref 0.50–1.10)
GFR calc Af Amer: >60 mL/min (ref >60)
GFR calc non Af Amer: >60 mL/min (ref >60)
Glucose, Bld: 87 mg/dL (ref 70–99)
Potassium: 3.6 mEq/L (ref 3.5–5.1)
Sodium: 137 mEq/L (ref 135–145)

## 2011-03-28 ENCOUNTER — Encounter: Payer: Self-pay | Admitting: *Deleted

## 2011-03-28 ENCOUNTER — Emergency Department (HOSPITAL_COMMUNITY): Payer: BC Managed Care – PPO

## 2011-03-28 ENCOUNTER — Emergency Department (HOSPITAL_COMMUNITY)
Admission: EM | Admit: 2011-03-28 | Discharge: 2011-03-28 | Disposition: A | Discharge status: Home / Self Care | Payer: BC Managed Care – PPO | Attending: Emergency Medicine | Admitting: Emergency Medicine

## 2011-03-28 DIAGNOSIS — F17200 Nicotine dependence, unspecified, uncomplicated: Secondary | ICD-10-CM | POA: Insufficient documentation

## 2011-03-28 DIAGNOSIS — S2249XA Multiple fractures of ribs, unspecified side, initial encounter for closed fracture: Secondary | ICD-10-CM

## 2011-03-28 DIAGNOSIS — R000 Tachycardia, unspecified: Secondary | ICD-10-CM | POA: Insufficient documentation

## 2011-03-28 DIAGNOSIS — W1809XA Striking against other object with subsequent fall, initial encounter: Secondary | ICD-10-CM | POA: Insufficient documentation

## 2011-03-28 DIAGNOSIS — R079 Chest pain, unspecified: Secondary | ICD-10-CM | POA: Insufficient documentation

## 2011-03-28 HISTORY — DX: Disorder of kidney and ureter, unspecified: N28.9

## 2011-03-28 HISTORY — DX: Depression, unspecified: F32.A

## 2011-03-28 HISTORY — DX: Gastro-esophageal reflux disease without esophagitis: K21.9

## 2011-03-28 HISTORY — DX: Other specified congenital malformation syndromes, not elsewhere classified: Q87.89

## 2011-03-28 HISTORY — DX: Anxiety disorder, unspecified: F41.9

## 2011-03-28 HISTORY — DX: Major depressive disorder, single episode, unspecified: F32.9

## 2011-03-28 MED ORDER — HYDROCODONE-ACETAMINOPHEN 10-325 MG PO TABS
1.0000 | ORAL_TABLET | Freq: Once | ORAL | Status: AC
  Administered 2011-03-28: 1 via ORAL
  Filled 2011-03-28: qty 1

## 2011-03-28 MED ORDER — AZITHROMYCIN 250 MG PO TABS: 500.0000 mg | ORAL_TABLET | Freq: Every day | ORAL | Status: DC

## 2011-03-28 MED ORDER — HYDROMORPHONE HCL PF 1 MG/ML IJ SOLN
1.0000 mg | Freq: Once | INTRAMUSCULAR | Status: AC
  Administered 2011-03-28: 1 mg via INTRAVENOUS
  Filled 2011-03-28: qty 1

## 2011-03-28 MED ORDER — HYDROCODONE-ACETAMINOPHEN 10-325 MG PO TABS: 1.0000 | ORAL_TABLET | ORAL | Status: AC | PRN

## 2011-03-28 MED ORDER — AZITHROMYCIN 250 MG PO TABS
1000.0000 mg | ORAL_TABLET | Freq: Once | ORAL | Status: AC
  Administered 2011-03-28: 1000 mg via ORAL
  Filled 2011-03-28: qty 4

## 2011-03-28 NOTE — ED Provider Notes (Signed)
History     CSN: [id number]  Arrival date & time 03/28/11  2005   First MD Initiated Contact with Patient 03/28/11 2141      Chief Complaint  Patient presents with  . Chest Pain    s/p fall x 2 weeks ago    (Consider location/radiation/quality/duration/timing/severity/associated sxs/prior treatment) HPI The patient presents with left sided rib pain. She recalls mechanical fall onto a wall 2 weeks ago. Since the event has been focal pain about the left inferior axilla. The pain is sharp, nonradiating, worse with deep inspiration, nonexertional. No relief with oral narcotics. No syncope, no dyspnea, no fevers, no chills, no cough, no vomiting Past Medical History  Diagnosis Date  . Klein syndrome     "degenerative bones"  . GERD (gastroesophageal reflux disease)   . Anxiety   . Depressed   . Renal disorder     nephrolithiasis     Past Surgical History  Procedure Date  . Foot surgery     bunion  . Abdominal hysterectomy   . Appendectomy   . Ureteral stent placement     History reviewed. No pertinent family history.  History  Substance Use Topics  . Smoking status: Current Everyday Smoker -- 0.5 packs/day for 20 years    Types: Cigarettes  . Smokeless tobacco: Not on file  . Alcohol Use: No    OB History    Grav Para Term Preterm Abortions TAB SAB Ect Mult Living                  Review of Systems  Constitutional:       HPI  HENT:       HPI otherwise negative  Eyes: Negative.   Respiratory:       HPI, otherwise negative  Cardiovascular:       HPI, otherwise nmegative  Gastrointestinal: Negative for vomiting.  Genitourinary:       HPI, otherwise negative  Musculoskeletal:       HPI, otherwise negative  Skin: Negative.   Neurological: Negative for syncope.    Allergies  Aspirin; Codeine; and Morphine and related  Home Medications   Current Outpatient Rx  Name Route Sig Dispense Refill  . HYDROXYZINE PAMOATE 25 MG PO CAPS Oral Take 25 mg by  mouth 3 (three) times daily as needed.      . OXYBUTYNIN CHLORIDE 5 MG PO TABS Oral Take 5 mg by mouth 3 (three) times daily.      Marland Kitchen PREDNISONE 20 MG PO TABS Oral Take 20 mg by mouth daily.      . SERTRALINE HCL 100 MG PO TABS Oral Take 200 mg by mouth daily.        BP 123/78  Pulse 110  Temp(Src) 100.5 F (38.1 C) (Oral)  Resp 20  SpO2 92%  Physical Exam  Nursing note and vitals reviewed. Constitutional: She is oriented to person, place, and time. She appears well-developed and well-nourished. No distress.  HENT:  Head: Normocephalic and atraumatic.  Eyes: Conjunctivae and EOM are normal.  Cardiovascular: Regular rhythm.  Tachycardia present.   Pulmonary/Chest: Breath sounds normal. No accessory muscle usage or stridor. No respiratory distress.    Abdominal: She exhibits no distension.  Musculoskeletal: She exhibits no edema.  Neurological: She is alert and oriented to person, place, and time. No cranial nerve deficit.  Skin: Skin is warm and dry.  Psychiatric: She has a normal mood and affect.    ED Course  Procedures (including critical care  time)  Labs Reviewed - No data to display No results found.   No diagnosis found.  Pulse ox 94% - RA - abnormal   CXr: reviewed by me - 5 fractured ribs.  MDM  This 55yo F p/w L rib pain following mech fall.  On exam she is in no distress, though uncomfortable.  O2 sats are borderline, and XR demonstrates 5 rib Fx.  Patient opts to go home, and will be d/c w instructions for increased analgesic use, incentive spirometry, and Azitho.        Gerhard Munch, MD 03/28/11 2233

## 2011-03-28 NOTE — ED Notes (Signed)
Pt fell against a wall 2 weeks ago after having her foot become entangled in a chair.  Pt landed upon said wall with her right side, particularly her right ribs.  Pt has been experiencing right sided rib pain since that point.  Pt went to her GP and was evaluated.  No x-rays were performed and pain medication was given.  Pt has been taking her pain medication as prescribed but without relief.

## 2013-10-11 ENCOUNTER — Institutional Professional Consult (permissible substitution): Payer: Self-pay | Admitting: Medical

## 2013-10-16 ENCOUNTER — Ambulatory Visit (INDEPENDENT_AMBULATORY_CARE_PROVIDER_SITE_OTHER): Payer: BC Managed Care – PPO | Admitting: Medical

## 2013-10-16 ENCOUNTER — Encounter: Payer: Self-pay | Admitting: Medical

## 2013-10-16 VITALS — BP 124/72 | HR 80 | Temp 98.4°F | Resp 12 | Ht 63.0 in | Wt 148.0 lb

## 2013-10-16 DIAGNOSIS — F3289 Other specified depressive episodes: Secondary | ICD-10-CM

## 2013-10-16 DIAGNOSIS — G4700 Insomnia, unspecified: Secondary | ICD-10-CM

## 2013-10-16 DIAGNOSIS — F32A Depression, unspecified: Secondary | ICD-10-CM

## 2013-10-16 DIAGNOSIS — K219 Gastro-esophageal reflux disease without esophagitis: Secondary | ICD-10-CM

## 2013-10-16 DIAGNOSIS — F411 Generalized anxiety disorder: Secondary | ICD-10-CM

## 2013-10-16 DIAGNOSIS — F329 Major depressive disorder, single episode, unspecified: Secondary | ICD-10-CM

## 2013-10-16 MED ORDER — OMEPRAZOLE 40 MG PO CPDR: DELAYED_RELEASE_CAPSULE | ORAL | Status: DC

## 2013-10-16 MED ORDER — SERTRALINE HCL 100 MG PO TABS: ORAL_TABLET | ORAL | Status: DC

## 2013-10-16 MED ORDER — VENLAFAXINE HCL 37.5 MG PO TABS: ORAL_TABLET | ORAL | Status: DC

## 2013-10-16 NOTE — Patient Instructions (Signed)
  Thank you for giving me the opportunity to serve you today.    Your diagnosis today includes: Encounter Diagnoses  Name Primary?  . Depression Yes  . Generalized anxiety disorder   . Insomnia     Return 3-4 wk.    I have included other useful information below for your review.  Counseling services   [facility] [phone] office [email or web address] [address] [place], [place], [place] [address]  Gale JourneyLaura Atkinson, therapist  Franchot ErichsenErik Nelson, MA, clinical psychologist  Cognitive-Behavior Therapy; Mood Disorders; Anxiety Disorders; adult and child ADHD; Family Therapy; Stress Management; personal growth, and Marital Therapy.    Carlus Pavlovennis McKnight Ph.D., clinical psychologist Cognitive-Behavior Therapy; Mood Disorders; Anxiety Disorders; Stress     Management   [facility] [address], [place], [place] [address] [phone]   [facility] Sheran LuzDesiree Wilkinson, psychotherapist [address] [place], [place] [address] [phone]   Miguel AschoffElaine Talbert Ph.D., clinical psychologist [phone] office [address] [place], [place] [address] Cognitive Behavior Therapy, Depression, Bipolar, Anxiety, Grief and Loss

## 2013-10-16 NOTE — Progress Notes (Signed)
Subjective:     [name] is a 58 y.o. female who presents as new patient.  Accompanied by her husband.  Was seeing Dr. Jeannetta NapElkins in [place] prior.  Was seeing him x6 years.  Prior had only seen urgent care periodically.  Last visit with Dr. Jeannetta NapElkins 1 year ago.    Here for medication refill.  Takes Zoloft 200mg  daily.  Wanted to change providers, didn't feel comfortable with advice from Dr. Jeannetta NapElkins.  Uses Zoloft for depression, been on this 2 years, but has been on several other medications prior.   Was on Celexa prior.  Has been on Paxil for 15 years but after a while it didn't help.   Was on Wellbutrin at one point, made her more depressed.  On Celexa had bad panic attacks.     She attributes her depression and anxiety to multiple things including death of her son.  He died in [id number] in a MVA.  Father just passed away 11/2012 Alzheimers.  Feels overwhelmed.  Is with her mother 24/7, mother doesn't do a lot for her self.  She handles all of her mother's affairs.  Her mother is 79yo.   Her mother lives with her, has COPD, bad arthritis, seizures, on oxygen and lung medications.   She is her mother's caregiver.  [place] in [place].  No other children.    It has been years since she has seen therapist.   Sleep is awful.  Has menopausal syptoms, hot flashes, status post hysterectomy, irritability, poor sleep.  Has used hydroxyzine and Nyquil prior for sleep.    She also notes bad GERD, was on GERD medication and oxybutynin for urinary incontinence  Married, lives with her husband.   Review of Systems General: No recent weight change, no anorexia Psychology: Denies suicidal ideation, hallucinations, alcohol use, drug use  Objective:    BP 124/72  Pulse 80  Temp(Src) 98.4 F (36.9 C)  Resp 12  Ht 5\' 3"  (1.6 m)  Wt 148 lb (67.132 kg)  BMI 26.22 kg/m2   General appearance: alert, no distress, WD/WN, female Psychiatric: normal affect, tearful at times, otherwise behavior  normal, pleasant, normal hygiene and grooming.        Assessment:   Encounter Diagnoses  Name Primary?  . Depression Yes  . Generalized anxiety disorder   . Insomnia   . Gastroesophageal reflux disease without esophagitis       Plan:    Discussed symptoms and concerns.  Advised counseling.  Advised they look into home health for mother's needs.   Discussed medication options, risk and benefits of medications.   Various medications considered.  Medications: begin weaning down on Zoloft by taking 1.5 tablets daily for one week, then 1 tablet daily for one week, then one half tablet daily for one week then stop.  Begin Venlafaxine 37.5 mg once daily for week, then twice daily.  Can continue Benadryl as needed for sleep.  Recheck in 2-3 weeks.  Return or call sooner if worse or no improvement.  Greater than 30 minutes spent face-to-face discussing concerns, evaluation, treatment plan, and followup.  GERD - begin omeprazole, avoid GERD triggers

## 2013-11-07 ENCOUNTER — Telehealth: Payer: Self-pay | Admitting: Medical

## 2013-11-07 ENCOUNTER — Ambulatory Visit (INDEPENDENT_AMBULATORY_CARE_PROVIDER_SITE_OTHER): Payer: BC Managed Care – PPO | Admitting: Medical

## 2013-11-07 ENCOUNTER — Encounter: Payer: Self-pay | Admitting: Medical

## 2013-11-07 VITALS — BP 150/96 | HR 80 | Temp 98.2°F | Resp 16 | Wt 144.0 lb

## 2013-11-07 DIAGNOSIS — K219 Gastro-esophageal reflux disease without esophagitis: Secondary | ICD-10-CM

## 2013-11-07 DIAGNOSIS — F329 Major depressive disorder, single episode, unspecified: Secondary | ICD-10-CM

## 2013-11-07 DIAGNOSIS — F411 Generalized anxiety disorder: Secondary | ICD-10-CM

## 2013-11-07 DIAGNOSIS — F3289 Other specified depressive episodes: Secondary | ICD-10-CM

## 2013-11-07 DIAGNOSIS — R41840 Attention and concentration deficit: Secondary | ICD-10-CM

## 2013-11-07 MED ORDER — VENLAFAXINE HCL 50 MG PO TABS: 50.0000 mg | ORAL_TABLET | Freq: Two times a day (BID) | ORAL | Status: DC

## 2013-11-07 MED ORDER — SUVOREXANT 10 MG PO TABS: 1.0000 | ORAL_TABLET | Freq: Every evening | ORAL | Status: DC | PRN

## 2013-11-07 NOTE — Telephone Encounter (Signed)
Please refer her to the following office for formal evaluation, possible adult ADD, anxiety.  I recommend she see the psychologist first and potentially have counseling.  I would also like for them to give me feedback regarding her medication.  I currently increased her Effexor to 50 mg twice daily, added on a sleep aid at night.  She was on Zoloft prior we switched her to [facility]   [facility] [phone] office [email or web address] [address] [place], [place], [place] [address]  Gale JourneyLaura Atkinson, therapist  Franchot ErichsenErik Nelson, MA, clinical psychologist  Cognitive-Behavior Therapy; Mood Disorders; Anxiety Disorders; adult and child ADHD; Family Therapy; Stress Management; personal growth, and Marital Therapy.    Carlus Pavlovennis McKnight Ph.D., clinical psychologist Cognitive-Behavior Therapy; Mood Disorders; Anxiety Disorders; Stress     Management

## 2013-11-07 NOTE — Progress Notes (Signed)
Subjective:     [name] is a 58 y.o. female who presents for f/u.  I saw her a few weeks ago as a new patient.  Accompanied by her husband.  Was seeing Dr. Jeannetta NapElkins in [place] prior.  Was seeing him x6 years.  Prior had only seen urgent care periodically.  Last visit with Dr. Jeannetta NapElkins 1 year ago.    At her last visit we weaned her off the Zoloft and increased her on to Effexor and so far she doesn't notice any major improvement.  She still c/o anxiety, insomnia, problems focusing.   Had been on Zoloft for depression,had been on this 2 years, but had been on several other medications prior.   Was on Celexa prior.  Has been on Paxil for 15 years but after a while it didn't help.   Was on Wellbutrin at one point, made her more depressed.  On Celexa had bad panic attacks.     She attributes her depression and anxiety to multiple things including death of her son.  He died in [id number] in a MVA.  Father just passed away 11/2012 Alzheimers.  Feels overwhelmed.  Is with her mother 24/7, mother doesn't do a lot for her self.  She handles all of her mother's affairs.  Her mother is 79yo.   Her mother lives with her, has COPD, bad arthritis, seizures, on oxygen and lung medications.   She is her mother's caregiver.  [place].  No other children.  It has been years since she has seen therapist.   Sleep is awful.  Has menopausal symptoms, hot flashes, status post hysterectomy, irritability, poor sleep.  Has used hydroxyzine and Nyquil prior for sleep.    She notes problems focusing, can't complete tasks, has always wondered for years about a diagnosis of ADD.  No prior stimulant mediation.    GERD is improved on Omeprazole.   Married, lives with her husband.   Review of Systems General: No recent weight change, no anorexia Psychology: Denies suicidal ideation, hallucinations, alcohol use, drug use  Objective:    BP 150/96  Pulse 80  Temp(Src) 98.2 F (36.8 C) (Oral)  Resp 16   Wt 144 lb (65.318 kg)   General appearance: alert, no distress, WD/WN, female Psychiatric: normal affect, behavior normal, pleasant, normal hygiene and grooming.        Assessment:   Encounter Diagnoses  Name Primary?  . Generalized anxiety disorder Yes  . Depressive disorder, not elsewhere classified   . Gastroesophageal reflux disease without esophagitis   . Attention deficit       Plan:   Refer to psychology and counseling.  Increase to Effexor 50mg  BID.  Begin sleep aid Belsomra.  Samples given.  Discussed risks/benfits of medication.   Advised they look into home health for mother's needs.     GERD - improved on omeprazole, avoid GERD triggers  Greater than 30 minutes spent face-to-face discussing concerns, evaluation, treatment plan, and followup.

## 2013-11-08 NOTE — Telephone Encounter (Signed)
I left a message on there voicemail. CLS

## 2013-11-11 NOTE — Telephone Encounter (Signed)
I spoke with Leotis ShamesLauren and gave her the patients information for a referral. CLS

## 2013-11-12 ENCOUNTER — Other Ambulatory Visit: Payer: Self-pay | Admitting: Medical

## 2013-11-12 ENCOUNTER — Telehealth: Payer: Self-pay | Admitting: Medical

## 2013-11-12 NOTE — Telephone Encounter (Signed)
Pt states Effexor not agreeing with her is making her really nauseous and would like to be kept on the Zoloft and taken off the Effexor. She [facility] [place]

## 2013-11-13 ENCOUNTER — Other Ambulatory Visit: Payer: Self-pay | Admitting: Medical

## 2013-11-13 MED ORDER — SERTRALINE HCL 100 MG PO TABS: 100.0000 mg | ORAL_TABLET | Freq: Every day | ORAL | Status: DC

## 2013-11-13 NOTE — Telephone Encounter (Signed)
Yes, I referred this patient to Marjie SkiffLauren atkinson. CLS  Patient is aware of your medication recommendations and she agreed and she will try that. CLS

## 2013-11-13 NOTE — Telephone Encounter (Signed)
Sense of Effexor and Zoloft are in 2 different classes of medications, let's have her add back on Zoloft at a half tablet daily at bedtime, and for now cut down on the Effexor to a half dose as well.  I believe she has tablets of Effexor.  Thus,lets use both medications combined at 1/2 dose to see how she does on this.  You did refer her to Dr. Ledon SnareMcKnight and Marjie SkiffLauren Atkinson correct?

## 2013-11-13 NOTE — Telephone Encounter (Signed)
LMOM TO CB. CLS 

## 2013-11-13 NOTE — Telephone Encounter (Signed)
How has the Belsomra helped with sleep?  Does she feel this worse "nervousness" just since starting the Belsomra?

## 2013-11-13 NOTE — Telephone Encounter (Signed)
Patient states that the medication was okay. It not something she wants to take on a regular. She said it made her have weird dreams. CLS

## 2014-01-02 ENCOUNTER — Other Ambulatory Visit: Payer: Self-pay | Admitting: Medical

## 2014-01-08 ENCOUNTER — Ambulatory Visit: Payer: BC Managed Care – PPO | Admitting: Medical

## 2014-02-10 ENCOUNTER — Encounter: Payer: Self-pay | Admitting: Medical

## 2014-02-10 ENCOUNTER — Ambulatory Visit (INDEPENDENT_AMBULATORY_CARE_PROVIDER_SITE_OTHER): Payer: BC Managed Care – PPO | Admitting: Medical

## 2014-02-10 VITALS — BP 102/80 | HR 68 | Temp 97.8°F | Resp 16 | Wt 145.0 lb

## 2014-02-10 DIAGNOSIS — Z23 Encounter for immunization: Secondary | ICD-10-CM

## 2014-02-10 DIAGNOSIS — IMO0002 Reserved for concepts with insufficient information to code with codable children: Secondary | ICD-10-CM

## 2014-02-10 DIAGNOSIS — R300 Dysuria: Secondary | ICD-10-CM

## 2014-02-10 DIAGNOSIS — Z9889 Other specified postprocedural states: Secondary | ICD-10-CM

## 2014-02-10 LAB — POCT URINALYSIS DIPSTICK
Bilirubin, UA: NEGATIVE
Glucose, UA: NEGATIVE
Ketones, UA: NEGATIVE
Nitrite, UA: POSITIVE
Spec Grav, UA: 1.02
Urobilinogen, UA: 0.2
pH, UA: 5

## 2014-02-10 MED ORDER — CIPROFLOXACIN HCL 500 MG PO TABS: 500.0000 mg | ORAL_TABLET | Freq: Two times a day (BID) | ORAL | Status: DC

## 2014-02-10 NOTE — Progress Notes (Signed)
Subjective:  Wendy HornerDebra J Tatsch is a 58 y.o. female who complains of possible urinary tract infection.  She has had symptoms for 2 weeks.  Symptoms include burning with urination, urine odor, cloudyh urine, urinary frequency and urgency.   Has had some chills.  Patient denies back pain, vaginal discharge and fever.  Last UTI was years ago.   Using Azo and monistat for current symptoms.  Patient does not have a history of recurrent UTI. Patient does not have a history of pyelonephritis.  No other aggravating or relieving factors.  No other c/o.  Past Medical History  Diagnosis Date  . Klein syndrome     "degenerative bones"  . GERD (gastroesophageal reflux disease)   . Anxiety   . Depressed   . Renal disorder     nephrolithiasis     ROS as in subjective  Reviewed allergies, medications, past medical, surgical, and social history.    Objective: Filed Vitals:   02/10/14 1517  BP: 102/80  Pulse: 68  Temp: 97.8 F (36.6 C)  Resp: 16    General appearance: alert, no distress, WD/WN, female Abdomen: +bs, soft, non tender, non distended, no masses, no hepatomegaly, no splenomegaly, no bruits Back: no CVA tenderness GU: deferred     Laboratory:  Results for orders placed or performed in visit on 02/10/14 (from the past 24 hour(s))  Urinalysis Dipstick     Status: None   Collection Time: 02/10/14  4:16 PM  Result Value Ref Range   Color, UA yellow    Clarity, UA cloudy    Glucose, UA neg    Bilirubin, UA neg    Ketones, UA neg    Spec Grav, UA 1.020    Blood, UA trace    pH, UA 5.0    Protein, UA trace    Urobilinogen, UA 0.2    Nitrite, UA positive    Leukocytes, UA large (3+)         Assessment: Encounter Diagnoses  Name Primary?  . Dysuria Yes  . History of ureter stent   . Need for prophylactic vaccination and inoculation against influenza      Plan: Discussed symptoms, diagnosis, possible complications, and usual course of illness.  Begin medication Cipro per  orders below.  Advised increased water intake, can use OTC Tylenol for pain.       Urine culture sent.    Call or return if worse or not improving.    Counseled on the influenza virus vaccine.  Vaccine information sheet given.  Influenza vaccine given after consent obtained.

## 2014-02-12 ENCOUNTER — Other Ambulatory Visit: Payer: Self-pay | Admitting: Medical

## 2014-02-12 NOTE — Telephone Encounter (Signed)
Is this okay?

## 2014-02-13 LAB — URINE CULTURE: Colony Count: >=100000

## 2014-02-14 ENCOUNTER — Other Ambulatory Visit: Payer: Self-pay | Admitting: Medical

## 2014-02-14 ENCOUNTER — Telehealth: Payer: Self-pay | Admitting: Family Medicine

## 2014-02-14 MED ORDER — ONDANSETRON HCL 4 MG PO TABS: 4.0000 mg | ORAL_TABLET | Freq: Three times a day (TID) | ORAL | Status: DC | PRN

## 2014-02-14 MED ORDER — NITROFURANTOIN MONOHYD MACRO 100 MG PO CAPS: 100.0000 mg | ORAL_CAPSULE | Freq: Two times a day (BID) | ORAL | Status: DC

## 2014-02-14 NOTE — Telephone Encounter (Signed)
Patient is aware of Kristian CoveyShane Tysinger [facility] message and she will try to continue with the medication

## 2014-02-14 NOTE — Telephone Encounter (Signed)
Husband called back to ask if anything has been done. But states that the cipro is not working and she has not finished all the med because it is making her sick on her stomach. But she is still having the same symptoms as when seen and want something different.

## 2014-02-14 NOTE — Telephone Encounter (Signed)
Patient called and said she was seen for a UTI and she finish all the medication. She said she still has the same Sx. She wants to know can you call out a Rx for Bactrim.

## 2014-02-14 NOTE — Telephone Encounter (Signed)
At this point is hard to say if the nausea or upset stomach is from the antibiotic versus the infection. I did send Zofran which she can use for nausea. Make sure she is drinking plenty of water. Take a probiotic to help with upset stomach, over-the-counter. My recommendation would be to continue Cipro. However if she is just adamant and I will send a different antibiotic, but it would be ideal to continue the same course since this infection is sensitive to cipro per culture.

## 2014-05-05 ENCOUNTER — Other Ambulatory Visit: Payer: Self-pay | Admitting: Medical

## 2014-05-06 NOTE — Telephone Encounter (Signed)
PATIENT  NEEDS TO SCHEDULE A FOLLOW UP APPOINTMENT ON HER MEDICATION'S BEFORE THE REFILL RUNS OUT.

## 2014-05-26 ENCOUNTER — Other Ambulatory Visit: Payer: Self-pay | Admitting: Medical

## 2014-05-26 ENCOUNTER — Telehealth: Payer: Self-pay | Admitting: Medical

## 2014-05-26 MED ORDER — SERTRALINE HCL 100 MG PO TABS: ORAL_TABLET | ORAL | Status: DC

## 2014-05-26 NOTE — Telephone Encounter (Signed)
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 Second of all, that patient has come in here twice once you changed her medication and she came back the second time and said the medication does not work. Patient is on Zoloft 200 mg 2 x a day. They are not happy. Patient's husband was not very nice to me on the phone

## 2014-05-26 NOTE — Telephone Encounter (Signed)
Her last visit for this was in August, so she is certainly past due for follow-up on medications particular given that we made changes at last visit and she wasn't seeing big improvements.  Given the stressors and issues we discussed originally at her first visit here I have encouraged her to seek counseling all along.  At her last visit and with subsequent phone calls I had her on Effexor 50 mg twice daily and added back Zoloft at low dose. I don't recall having her on 200 mg twice daily for sure.    So I assume she is currently not taking Effexor at all?  If she is actually taking Zoloft 200 mg twice daily and not 100mg  BID (if I understood your message correctly), then I certainly don't feel comfortable prescribing that dose.  If she is on 100 mg BID and its helping some, then I can refill this temporarily to give time to get back in for f/u.

## 2014-05-26 NOTE — Telephone Encounter (Signed)
We made med changes last visit adding Effexor and recommended counseling.  A) is she seeing counseling?  B) verify what she is actually taking of each Zoloft and Effexor?  Milligram and frequency?

## 2014-05-26 NOTE — Telephone Encounter (Signed)
Pt's husband called and stated pt is completely out of zoloft. He states she is taking two a day. Pt's husband was informed that she needed an appt. 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 Pt is requesting refills. [facility] [address] and can be reached at [phone].

## 2014-05-27 ENCOUNTER — Telehealth: Payer: Self-pay | Admitting: Internal Medicine

## 2014-05-27 MED ORDER — SERTRALINE HCL 100 MG PO TABS
ORAL_TABLET | ORAL | Status: DC
Start: 2014-05-27 — End: 2018-05-22

## 2014-05-27 NOTE — Telephone Encounter (Signed)
Patient states that her husband was wrong about the dose. She takes 100 mg BID of Zoloft a day. I went over the message in full detail and Kristian CoveyShane Tysinger PA recommendations. I also made her aware again that she is due for medication check and will need to come in over the next 30 days. I tried to set up her appointment over the phone but she declined to schedule the appointment over the phone. She states she would need to call back and set the appointment up.

## 2014-05-27 NOTE — Telephone Encounter (Signed)
I sent refill last night and will await her appt

## 2014-05-27 NOTE — Telephone Encounter (Signed)
LMOM TO CB. CLS 

## 2014-05-27 NOTE — Telephone Encounter (Signed)
The directions were wrong

## 2018-05-22 ENCOUNTER — Encounter: Payer: Self-pay | Admitting: Cardiology

## 2018-05-22 ENCOUNTER — Ambulatory Visit: Payer: BLUE CROSS/BLUE SHIELD | Admitting: Cardiology

## 2018-05-22 VITALS — BP 162/78 | HR 71 | Ht 63.0 in | Wt 157.0 lb

## 2018-05-22 DIAGNOSIS — F17210 Nicotine dependence, cigarettes, uncomplicated: Secondary | ICD-10-CM | POA: Insufficient documentation

## 2018-05-22 DIAGNOSIS — I10 Essential (primary) hypertension: Secondary | ICD-10-CM | POA: Diagnosis not present

## 2018-05-22 DIAGNOSIS — E782 Mixed hyperlipidemia: Secondary | ICD-10-CM

## 2018-05-22 DIAGNOSIS — E785 Hyperlipidemia, unspecified: Secondary | ICD-10-CM | POA: Insufficient documentation

## 2018-05-22 DIAGNOSIS — R0789 Other chest pain: Secondary | ICD-10-CM | POA: Diagnosis not present

## 2018-05-22 DIAGNOSIS — F17200 Nicotine dependence, unspecified, uncomplicated: Secondary | ICD-10-CM | POA: Insufficient documentation

## 2018-05-22 MED ORDER — NITROGLYCERIN 0.4 MG SL SUBL: 0.4000 mg | SUBLINGUAL_TABLET | SUBLINGUAL | 11 refills | Status: DC | PRN

## 2018-05-22 MED ORDER — TELMISARTAN 40 MG PO TABS: 40.0000 mg | ORAL_TABLET | Freq: Every day | ORAL | 3 refills | Status: DC

## 2018-05-22 NOTE — Progress Notes (Signed)
Cardiology Office Note:    Date:  05/22/2018   ID:  [name], DOB [birth year], MRN [id number]  PCP:  Ronal Fear, NP  Cardiologist:  Garwin Brothers, MD   Referring MD: Ronal Fear, NP    ASSESSMENT:    1. Chest discomfort   2. Essential hypertension   3. Mixed dyslipidemia   4. Cigarette smoker    PLAN:    In order of problems listed above:  1. Primary prevention stressed with the patient.  Importance of compliance with diet and medication stressed and she vocalized understanding.  Her blood pressure is elevated.  Diet was discussed for essential hypertension and dyslipidemia. 2. In view of elevated blood pressure I will start her on telmisartan 40 mg daily.  She will have all blood work today including fasting lipids.  We will check her blood work again a Chem-7 when she comes for a stress test. 3. In view of her symptoms she will have exercise stress Cardiolite.  She has multiple risk factors for coronary artery disease.  Echocardiogram will be done to assess murmur heard on auscultation. 4. Sublingual nitroglycerin prescription was sent, its protocol and 911 protocol explained and the patient vocalized understanding questions were answered to the patient's satisfaction 5. I spent 5 minutes with the patient discussing solely about smoking. Smoking cessation was counseled. I suggested to the patient also different medications and pharmacological interventions. Patient is keen to try stopping on its own at this time. He will get back to me if he needs any further assistance in this matter. 6. Patient will be seen in follow-up appointment in 2 months or earlier if the patient has any concerns    Medication Adjustments/Labs and Tests Ordered: Current medicines are reviewed at length with the patient today.  Concerns regarding medicines are outlined above.  No orders of the defined types were placed in this encounter.  No orders of the defined types were placed in this  encounter.    History of Present Illness:    [name] is a 63 y.o. female who is being seen today for the evaluation of chest discomfort at the request of Lam, Tawny Asal, NP.  Patient is a pleasant 63 year old female.  She has past medical history of essential hypertension, dyslipidemia and is a heavy smoker.  She mentions to me that she occasionally has chest discomfort.  She says this is more of a heartbeat awareness situation.  She does not exercise on a regular basis but whenever she is under stress she says she can feel her heart beating faster.  Some chest discomfort may have been at this time.  She is not sure about this.  No radiation to any part of the body.  No orthopnea or PND.  At the time of my evaluation, the patient is alert awake oriented and in no distress.  Past Medical History:  Diagnosis Date  . Anxiety   . Depressed   . GERD (gastroesophageal reflux disease)   . Klein syndrome    "degenerative bones"  . Renal disorder    nephrolithiasis     Past Surgical History:  Procedure Laterality Date  . ABDOMINAL HYSTERECTOMY    . APPENDECTOMY    . FOOT SURGERY     bunion  . URETERAL STENT PLACEMENT      Current Medications: Current Meds  Medication Sig  . pantoprazole (PROTONIX) 40 MG tablet Take 1 tablet by mouth daily.     Allergies:  Aspirin; Codeine; and Morphine and related   Social History   Socioeconomic History  . Marital status: Married    Spouse name: Not on file  . Number of children: Not on file  . Years of education: Not on file  . Highest education level: Not on file  Occupational History  . Not on file  Social Needs  . Financial resource strain: Not on file  . Food insecurity:    Worry: Not on file    Inability: Not on file  . Transportation needs:    Medical: Not on file    Non-medical: Not on file  Tobacco Use  . Smoking status: Current Every Day Smoker    Packs/day: 0.50    Years: 20.00    Pack years: 10.00    Types: Cigarettes   . Smokeless tobacco: Never Used  Substance and Sexual Activity  . Alcohol use: No  . Drug use: No  . Sexual activity: Yes    Birth control/protection: Surgical  Lifestyle  . Physical activity:    Days per week: Not on file    Minutes per session: Not on file  . Stress: Not on file  Relationships  . Social connections:    Talks on phone: Not on file    Gets together: Not on file    Attends religious service: Not on file    Active member of club or organization: Not on file    Attends meetings of clubs or organizations: Not on file    Relationship status: Not on file  Other Topics Concern  . Not on file  Social History Narrative  . Not on file     Family History: The patient's family history is not on file.  ROS:   Please see the history of present illness.    All other systems reviewed and are negative.  EKGs/Labs/Other Studies Reviewed:    The following studies were reviewed today: I discussed my findings with the patient at extensive length.  I found an LDL from 2018 to be 224.   Recent Labs: No results found for requested labs within last 8760 hours.  Recent Lipid Panel No results found for: CHOL, TRIG, HDL, CHOLHDL, VLDL, LDLCALC, LDLDIRECT  Physical Exam:    VS:  BP (!) 162/78 (BP Location: Right Arm, Patient Position: Sitting, Cuff Size: Normal)   Pulse 71   Ht 5\' 3"  (1.6 m)   Wt 157 lb (71.2 kg)   SpO2 96%   BMI 27.81 kg/m     Wt Readings from Last 3 Encounters:  05/22/18 157 lb (71.2 kg)  02/10/14 145 lb (65.8 kg)  11/07/13 144 lb (65.3 kg)     GEN: Patient is in no acute distress HEENT: Normal NECK: No JVD; No carotid bruits LYMPHATICS: No lymphadenopathy CARDIAC: S1 S2 regular, 2/6 systolic murmur at the apex. RESPIRATORY:  Clear to auscultation without rales, wheezing or rhonchi  ABDOMEN: Soft, non-tender, non-distended MUSCULOSKELETAL:  No edema; No deformity  SKIN: Warm and dry NEUROLOGIC:  Alert and oriented x 3 PSYCHIATRIC:  Normal  affect    Signed, Garwin Brothers, MD  05/22/2018 10:22 AM    [facility]

## 2018-05-22 NOTE — Patient Instructions (Signed)
Medication Instructions:   Your physician has recommended you make the following change in your medication:   START: Telmisartan 40 mg by mouth once daily.  TAKE: Nitroglycerin 0.4 mg by mouth for CHEST PAIN AS NEEDED ONLY!  When having chest pain, stop what you are doing and sit down. Take 1 nitro, wait 5 minutes. Still having chest pain, take 1 nitro, wait 5 minutes. Still having chest pain, take 1 nitro, dial 911. Total of 3 nitro in 15 minutes.    If you need a refill on your cardiac medications before your next appointment, please call your pharmacy.   Lab work:  Your physician recommends that you return for lab work today: BMP, CBC, TSH, LFT, LIPIDS.  When returning for stress test you will also need to more lab work for just a BMP   If you have labs (blood work) drawn today and your tests are completely normal, you will receive your results only by: Marland Kitchen MyChart Message (if you have MyChart) OR . A paper copy in the mail If you have any lab test that is abnormal or we need to change your treatment, we will call you to review the results.  Testing/Procedures:  Your physician has requested that you have an echocardiogram. Echocardiography is a painless test that uses sound waves to create images of your heart. It provides your doctor with information about the size and shape of your heart and how well your heart's chambers and valves are working. This procedure takes approximately one hour. There are no restrictions for this procedure.  Your physician has requested that you have en exercise stress myoview. For further information please visit [email or web address] Please follow instruction sheet, as given.    Follow-Up: At [facility], you and your health needs are our priority.  As part of our continuing mission to provide you with exceptional heart care, we have created designated Provider Care Teams.  These Care Teams include your primary Cardiologist (physician) and  Advanced Practice Providers (APPs -  Physician Assistants and Nurse Practitioners) who all work together to provide you with the care you need, when you need it.  You will need a follow up appointment in 4 months.   Any Other Special Instructions Will Be Listed Below (If Applicable).  Nitroglycerin sublingual tablets What is this medicine? NITROGLYCERIN (nye troe GLI ser in) is a type of vasodilator. It relaxes blood vessels, increasing the blood and oxygen supply to your heart. This medicine is used to relieve chest pain caused by angina. It is also used to prevent chest pain before activities like climbing stairs, going outdoors in cold weather, or sexual activity. This medicine may be used for other purposes; ask your health care provider or pharmacist if you have questions. COMMON BRAND NAME(S): Nitroquick, Nitrostat, Nitrotab What should I tell my health care provider before I take this medicine? They need to know if you have any of these conditions: -anemia -head injury, recent stroke, or bleeding in the brain -liver disease -previous heart attack -an unusual or allergic reaction to nitroglycerin, other medicines, foods, dyes, or preservatives -pregnant or trying to get pregnant -breast-feeding How should I use this medicine? Take this medicine by mouth as needed. At the first sign of an angina attack (chest pain or tightness) place one tablet under your tongue. You can also take this medicine 5 to 10 minutes before an event likely to produce chest pain. Follow the directions on the prescription label. Let the tablet dissolve under the tongue.  Do not swallow whole. Replace the dose if you accidentally swallow it. It will help if your mouth is not dry. Saliva around the tablet will help it to dissolve more quickly. Do not eat or drink, smoke or chew tobacco while a tablet is dissolving. If you are not better within 5 minutes after taking ONE dose of nitroglycerin, call 9-1-1 immediately to  seek emergency medical care. Do not take more than 3 nitroglycerin tablets over 15 minutes. If you take this medicine often to relieve symptoms of angina, your doctor or health care professional may provide you with different instructions to manage your symptoms. If symptoms do not go away after following these instructions, it is important to call 9-1-1 immediately. Do not take more than 3 nitroglycerin tablets over 15 minutes. Talk to your pediatrician regarding the use of this medicine in children. Special care may be needed. Overdosage: If you think you have taken too much of this medicine contact a poison control center or emergency room at once. NOTE: This medicine is only for you. Do not share this medicine with others. What if I miss a dose? This does not apply. This medicine is only used as needed. What may interact with this medicine? Do not take this medicine with any of the following medications: -certain migraine medicines like ergotamine and dihydroergotamine (DHE) -medicines used to treat erectile dysfunction like sildenafil, tadalafil, and vardenafil -riociguat This medicine may also interact with the following medications: -alteplase -aspirin -heparin -medicines for high blood pressure -medicines for mental depression -other medicines used to treat angina -phenothiazines like chlorpromazine, mesoridazine, prochlorperazine, thioridazine This list may not describe all possible interactions. Give your health care provider a list of all the medicines, herbs, non-prescription drugs, or dietary supplements you use. Also tell them if you smoke, drink alcohol, or use illegal drugs. Some items may interact with your medicine. What should I watch for while using this medicine? Tell your doctor or health care professional if you feel your medicine is no longer working. Keep this medicine with you at all times. Sit or lie down when you take your medicine to prevent falling if you feel  dizzy or faint after using it. Try to remain calm. This will help you to feel better faster. If you feel dizzy, take several deep breaths and lie down with your feet propped up, or bend forward with your head resting between your knees. You may get drowsy or dizzy. Do not drive, use machinery, or do anything that needs mental alertness until you know how this drug affects you. Do not stand or sit up quickly, especially if you are an older patient. This reduces the risk of dizzy or fainting spells. Alcohol can make you more drowsy and dizzy. Avoid alcoholic drinks. Do not treat yourself for coughs, colds, or pain while you are taking this medicine without asking your doctor or health care professional for advice. Some ingredients may increase your blood pressure. What side effects may I notice from receiving this medicine? Side effects that you should report to your doctor or health care professional as soon as possible: -blurred vision -dry mouth -skin rash -sweating -the feeling of extreme pressure in the head -unusually weak or tired Side effects that usually do not require medical attention (report to your doctor or health care professional if they continue or are bothersome): -flushing of the face or neck -headache -irregular heartbeat, palpitations -nausea, vomiting This list may not describe all possible side effects. Call your doctor for  medical advice about side effects. You may report side effects to FDA at 1-800-FDA-1088. Where should I keep my medicine? Keep out of the reach of children. Store at room temperature between 20 and 25 degrees C (68 and 77 degrees F). Store in Retail buyeroriginal container. Protect from light and moisture. Keep tightly closed. Throw away any unused medicine after the expiration date. NOTE: This sheet is a summary. It may not cover all possible information. If you have questions about this medicine, talk to your doctor, pharmacist, or health care provider.  2019  Elsevier/Gold Standard (2013-01-17 17:57:36)   Telmisartan tablets What is this medicine? TELMISARTAN (tel mi SAR tan) is used to treat high blood pressure. This medicine may be used for other purposes; ask your health care provider or pharmacist if you have questions. COMMON BRAND NAME(S): Micardis What should I tell my health care provider before I take this medicine? They need to know if you have any of these conditions: -if you are on a special diet, such as a low-salt diet -kidney or liver disease -an unusual or allergic reaction to telmisartan, other medicines, foods, dyes, or preservatives -pregnant or trying to get pregnant -breast-feeding How should I use this medicine? Take this medicine by mouth with a glass of water. Follow the directions on the prescription label. This medicine can be taken with or without food. Take your doses at regular intervals. Do not take your medicine more often than directed. Talk to your pediatrician regarding the use of this medicine in children. Special care may be needed. Overdosage: If you think you have taken too much of this medicine contact a poison control center or emergency room at once. NOTE: This medicine is only for you. Do not share this medicine with others. What if I miss a dose? If you miss a dose, take it as soon as you can. If it is almost time for your next dose, take only that dose. Do not take double or extra doses. What may interact with this medicine? -digoxin -potassium salts or potassium supplements -warfarin This list may not describe all possible interactions. Give your health care provider a list of all the medicines, herbs, non-prescription drugs, or dietary supplements you use. Also tell them if you smoke, drink alcohol, or use illegal drugs. Some items may interact with your medicine. What should I watch for while using this medicine? Visit your doctor or health care professional for regular checks on your progress.  Check your blood pressure as directed. Ask your doctor or health care professional what your blood pressure should be and when you should contact him or her. Call your doctor or health care professional if you notice an irregular or fast heart beat. Women should inform their doctor if they wish to become pregnant or think they might be pregnant. There is a potential for serious side effects to an unborn child, particularly in the second or third trimester. Talk to your health care professional or pharmacist for more information. You may get drowsy or dizzy. Do not drive, use machinery, or do anything that needs mental alertness until you know how this drug affects you. Do not stand or sit up quickly, especially if you are an older patient. This reduces the risk of dizzy or fainting spells. Alcohol can make you more drowsy and dizzy. Avoid alcoholic drinks. Avoid salt substitutes unless you are told otherwise by your doctor or health care professional. Do not treat yourself for coughs, colds, or pain while you are taking  this medicine without asking your doctor or health care professional for advice. Some ingredients may increase your blood pressure. What side effects may I notice from receiving this medicine? Side effects that you should report to your doctor or health care professional as soon as possible: -allergic reactions like skin rash, itching or hives, swelling of the face, lips, or tongue -breathing problems -dark urine -gout pain -muscle pains -slow heartbeat -trouble passing urine or change in the amount of urine -unusual bleeding or bruising -yellowing of the eyes or skin Side effects that usually do not require medical attention (report to your doctor or health care professional if they continue or are bothersome): -back pain -change in sex drive or performance -diarrhea -sore throat or stuffy nose This list may not describe all possible side effects. Call your doctor for medical  advice about side effects. You may report side effects to FDA at 1-800-FDA-1088. Where should I keep my medicine? Keep out of the reach of children. Store at room temperature between 15 and 30 degrees C (59 and 86 degrees F). Tablets should not be removed from the blisters until right before use. Throw away any unused medicine after the expiration date. NOTE: This sheet is a summary. It may not cover all possible information. If you have questions about this medicine, talk to your doctor, pharmacist, or health care provider.  2019 Elsevier/Gold Standard (2007-06-06 13:39:10)

## 2018-05-23 ENCOUNTER — Telehealth: Payer: Self-pay

## 2018-05-23 LAB — HEPATIC FUNCTION PANEL
ALT: 20 IU/L (ref 0–32)
AST: 16 IU/L (ref 0–40)
Albumin: 4.3 g/dL (ref 3.8–4.9)
Alkaline Phosphatase: 82 IU/L (ref 39–117)
Bilirubin Total: 0.3 mg/dL (ref 0.0–1.2)
Bilirubin, Direct: 0.07 mg/dL (ref 0.00–0.40)
Total Protein: 6.9 g/dL (ref 6.0–8.5)

## 2018-05-23 LAB — CBC
Hematocrit: 39.3 % (ref 34.0–46.6)
Hemoglobin: 13.3 g/dL (ref 11.1–15.9)
MCH: 31 pg (ref 26.6–33.0)
MCHC: 33.8 g/dL (ref 31.5–35.7)
MCV: 92 fL (ref 79–97)
Platelets: 208 10*3/uL (ref 150–450)
RBC: 4.29 x10E6/uL (ref 3.77–5.28)
RDW: 12.8 % (ref 11.7–15.4)
WBC: 5.6 10*3/uL (ref 3.4–10.8)

## 2018-05-23 LAB — LIPID PANEL
Chol/HDL Ratio: 6.3 ratio — ABNORMAL HIGH (ref 0.0–4.4)
Cholesterol, Total: 302 mg/dL — ABNORMAL HIGH (ref 100–199)
HDL: 48 mg/dL (ref >39)
LDL Calculated: 236 mg/dL — ABNORMAL HIGH (ref 0–99)
Triglycerides: 89 mg/dL (ref 0–149)
VLDL Cholesterol Cal: 18 mg/dL (ref 5–40)

## 2018-05-23 LAB — BASIC METABOLIC PANEL
BUN/Creatinine Ratio: 15 (ref 12–28)
BUN: 11 mg/dL (ref 8–27)
CO2: 26 mmol/L (ref 20–29)
Calcium: 9 mg/dL (ref 8.7–10.3)
Chloride: 100 mmol/L (ref 96–106)
Creatinine, Ser: 0.74 mg/dL (ref 0.57–1.00)
GFR calc Af Amer: 102 mL/min/{1.73_m2} (ref >59)
GFR calc non Af Amer: 88 mL/min/{1.73_m2} (ref >59)
Glucose: 101 mg/dL — ABNORMAL HIGH (ref 65–99)
Potassium: 3.9 mmol/L (ref 3.5–5.2)
Sodium: 141 mmol/L (ref 134–144)

## 2018-05-23 LAB — TSH: TSH: 6.77 u[IU]/mL — ABNORMAL HIGH (ref 0.450–4.500)

## 2018-05-23 MED ORDER — ROSUVASTATIN CALCIUM 20 MG PO TABS: 20.0000 mg | ORAL_TABLET | Freq: Every day | ORAL | 3 refills | Status: DC

## 2018-05-23 NOTE — Telephone Encounter (Signed)
The patient has been notified of the results. Instructed to call PCP and new medication request sent to [facility] drug. Patient verbalized understanding.  All questions were answered. Copy of results sent to PCP.

## 2018-05-23 NOTE — Telephone Encounter (Signed)
-----   Message from Garwin Brothers, MD sent at 05/23/2018  8:14 AM EST ----- Diet for elevated lipids. crestor 20mg  and ll 6wks. Needs to see pcp for thyroid. Call pcp nurse and send lab results to them. Garwin Brothers, MD 05/23/2018 8:13 AM

## 2018-06-05 DIAGNOSIS — R0602 Shortness of breath: Secondary | ICD-10-CM | POA: Insufficient documentation

## 2018-06-26 ENCOUNTER — Other Ambulatory Visit: Payer: BLUE CROSS/BLUE SHIELD

## 2018-09-24 ENCOUNTER — Ambulatory Visit: Payer: BLUE CROSS/BLUE SHIELD | Admitting: Cardiology

## 2020-06-25 ENCOUNTER — Other Ambulatory Visit: Payer: Self-pay | Admitting: *Deleted

## 2020-06-25 ENCOUNTER — Other Ambulatory Visit: Payer: Self-pay

## 2020-06-25 ENCOUNTER — Ambulatory Visit: Payer: 59 | Admitting: Podiatry

## 2020-06-25 ENCOUNTER — Encounter: Payer: Self-pay | Admitting: Podiatry

## 2020-06-25 DIAGNOSIS — B351 Tinea unguium: Secondary | ICD-10-CM | POA: Diagnosis not present

## 2020-06-25 DIAGNOSIS — K581 Irritable bowel syndrome with constipation: Secondary | ICD-10-CM | POA: Insufficient documentation

## 2020-06-25 DIAGNOSIS — M79609 Pain in unspecified limb: Secondary | ICD-10-CM

## 2020-06-25 DIAGNOSIS — M79676 Pain in unspecified toe(s): Secondary | ICD-10-CM | POA: Diagnosis not present

## 2020-06-25 MED ORDER — FLUCONAZOLE 150 MG PO TABS: 150.0000 mg | ORAL_TABLET | ORAL | 2 refills | Status: DC

## 2020-06-25 NOTE — Progress Notes (Signed)
  Subjective:  Patient ID: [name], female    DOB: 08-29-1955,  MRN: [id number]  Chief Complaint  Patient presents with  . Nail Problem    Fungus toenails     65 y.o. female presents with the above complaint. History confirmed with patient.  Reports concern of changes of thickening and difficulty to cut the right big toe second toe left big toe.  Nails are curling they are sore and tender and she cannot wear shoes and socks because of it  Objective:  Physical Exam: warm, good capillary refill, nail exam onychomycosis of the toenails and dystrophic nails, no trophic changes or ulcerative lesions, normal DP and PT pulses and normal sensory exam. Left Foot: normal exam, no swelling, tenderness, instability; ligaments intact, full range of motion of all ankle/foot joints  Right Foot: normal exam, no swelling, tenderness, instability; ligaments intact, full range of motion of all ankle/foot joints   Assessment:   1. Pain due to onychomycosis of nail      Plan:  Patient was evaluated and treated and all questions answered.  Onychomycosis -Educated on etiology. Discussed possible traumatic cause -Rx fluconazole weekly. Educated on r/b and use. -Nails debrided 2/2 pain. -Ok to continue clotrimazole topically. -F/u in 2 months to assess progress  Return in about 2 months (around 08/25/2020) for Nail Fungus.

## 2020-08-27 ENCOUNTER — Ambulatory Visit: Payer: 59 | Admitting: Podiatry

## 2020-09-17 ENCOUNTER — Ambulatory Visit: Payer: 59 | Admitting: Podiatry

## 2021-07-14 ENCOUNTER — Ambulatory Visit: Payer: Managed Care, Other (non HMO) | Admitting: Cardiology

## 2021-07-14 ENCOUNTER — Ambulatory Visit (INDEPENDENT_AMBULATORY_CARE_PROVIDER_SITE_OTHER): Payer: Commercial Managed Care - HMO

## 2021-07-14 ENCOUNTER — Encounter: Payer: Self-pay | Admitting: Cardiology

## 2021-07-14 VITALS — BP 170/86 | HR 74 | Ht 66.0 in | Wt 146.2 lb

## 2021-07-14 DIAGNOSIS — F17200 Nicotine dependence, unspecified, uncomplicated: Secondary | ICD-10-CM | POA: Diagnosis not present

## 2021-07-14 DIAGNOSIS — E785 Hyperlipidemia, unspecified: Secondary | ICD-10-CM

## 2021-07-14 DIAGNOSIS — R0609 Other forms of dyspnea: Secondary | ICD-10-CM

## 2021-07-14 DIAGNOSIS — I10 Essential (primary) hypertension: Secondary | ICD-10-CM

## 2021-07-14 DIAGNOSIS — R002 Palpitations: Secondary | ICD-10-CM | POA: Diagnosis not present

## 2021-07-14 MED ORDER — AMLODIPINE BESYLATE 5 MG PO TABS: 5.0000 mg | ORAL_TABLET | Freq: Every day | ORAL | 3 refills | Status: DC

## 2021-07-14 NOTE — Progress Notes (Signed)
? ?Cardiology Consultation:   ? ?Date:  07/14/2021  ? ?ID:  [name], DOB [birth year], MRN [id number] ? ?PCP:  Ronal Fear, NP  ?Cardiologist:  Gypsy Balsam, MD  ? ?Referring MD: Ronal Fear, NP  ? ?Chief Complaint  ?Patient presents with  ? Palpitations  ? Hypertension  ?  Ongoing for a couple months  ? ? ?History of Present Illness:   ? ?[name] is a 66 y.o. female who is being seen today for the evaluation of palpitations at the request of Lam, Tawny Asal, NP.  Past medical history significant for essential hypertension, anxiety, GERD, nephrolithiasis.  She was seen by my partner more than 3 years ago and the reason for that visit was atypical chest pain.  Sadly she still continues to smoke.  After that she did see another cardiologist in different practice for episode of chest pain apparently stress echocardiogram has been scheduled but I am not sure if it was performed.  She comes today to my office however for different reasons present for last few months has been experiencing palpitations what she mean by that she will feel her heart skipping and flip-flopping but no sustained arrhythmias.  She described also to have some exertional shortness of breath but no chest pain tightness squeezing pressure burning chest.  Sadly she still continues to smoke she said she smokes only 6 cigarettes a day but still continues to smoke.  She does not exercise on a regular basis but try to be more active.  She is not on any special diet. ? ?Past Medical History:  ?Diagnosis Date  ? Anxiety   ? Depressed   ? GERD (gastroesophageal reflux disease)   ? Klein syndrome   ? "degenerative bones"  ? Renal disorder   ? nephrolithiasis   ? ? ?Past Surgical History:  ?Procedure Laterality Date  ? ABDOMINAL HYSTERECTOMY    ? APPENDECTOMY    ? FOOT SURGERY    ? bunion  ? URETERAL STENT PLACEMENT    ? ? ?Current Medications: ?Current Meds  ?Medication Sig  ? Cyanocobalamin ([employer] VITAMIN B12 PO) Take 1 tablet by mouth daily.  ?  esomeprazole (NEXIUM) 20 MG capsule Take 20 mg by mouth daily at 12 noon.  ? Probiotic Product (PROBIOTIC DAILY PO) Take 1 tablet by mouth daily.  ? Sennosides-Docusate Sodium (STOOL SOFTENER/LAXATIVE PO) Take 1 tablet by mouth as needed (Constipation).  ?  ? ?Allergies:   Aspirin, Codeine, and Morphine and related  ? ?Social History  ? ?Socioeconomic History  ? Marital status: Married  ?  Spouse name: Not on file  ? Number of children: Not on file  ? Years of education: Not on file  ? Highest education level: Not on file  ?Occupational History  ? Not on file  ?Tobacco Use  ? Smoking status: Every Day  ?  Packs/day: 0.50  ?  Years: 20.00  ?  Pack years: 10.00  ?  Types: Cigarettes  ? Smokeless tobacco: Never  ?Substance and Sexual Activity  ? Alcohol use: No  ? Drug use: No  ? Sexual activity: Yes  ?  Birth control/protection: Surgical  ?Other Topics Concern  ? Not on file  ?Social History Narrative  ? Not on file  ? ?Social Determinants of Health  ? ?Financial Resource Strain: Not on file  ?Food Insecurity: Not on file  ?Transportation Needs: Not on file  ?Physical Activity: Not on file  ?Stress: Not on file  ?Social  Connections: Not on file  ?  ? ?Family History: ?The patient's family history includes Heart attack in her sister; Heart murmur in her brother, mother, and sister. ?ROS:   ?Please see the history of present illness.    ?All 14 point review of systems negative except as described per history of present illness. ? ?EKGs/Labs/Other Studies Reviewed:   ? ?The following studies were reviewed today: ? ? ?EKG:  EKG is  ordered today.  The ekg ordered today demonstrates normal sinus rhythm, normal P interval, normal QS complex duration morphology, no ST segment changes ? ?Recent Labs: ?No results found for requested labs within last 8760 hours.  ?Recent Lipid Panel ?   ?Component Value Date/Time  ? CHOL 302 (H) 05/22/2018 1105  ? TRIG 89 05/22/2018 1105  ? HDL 48 05/22/2018 1105  ? CHOLHDL 6.3 (H) 05/22/2018  1105  ? LDLCALC 236 (H) 05/22/2018 1105  ? ? ?Physical Exam:   ? ?VS:  BP (!) 170/86 (BP Location: Right Arm, Patient Position: Sitting)   Pulse 74   Ht 5\' 6"  (1.676 m)   Wt 146 lb 3.2 oz (66.3 kg)   SpO2 96%   BMI 23.60 kg/m?    ? ?Wt Readings from Last 3 Encounters:  ?07/14/21 146 lb 3.2 oz (66.3 kg)  ?05/22/18 157 lb (71.2 kg)  ?02/10/14 145 lb (65.8 kg)  ?  ? ?GEN:  Well nourished, well developed in no acute distress ?HEENT: Normal ?NECK: No JVD; No carotid bruits ?LYMPHATICS: No lymphadenopathy ?CARDIAC: RRR, no murmurs, no rubs, no gallops ?RESPIRATORY:  Clear to auscultation without rales, wheezing or rhonchi  ?ABDOMEN: Soft, non-tender, non-distended ?MUSCULOSKELETAL:  No edema; No deformity  ?SKIN: Warm and dry ?NEUROLOGIC:  Alert and oriented x 3 ?PSYCHIATRIC:  Normal affect  ? ?ASSESSMENT:   ? ?1. Essential hypertension   ?2. Palpitations   ?3. Dyslipidemia   ?4. Smoking   ? ?PLAN:   ? ?In order of problems listed above: ? ?Essential hypertension.  Still uncontrolled.  She used to take Micardis however she said she could not tolerate it she felt horrible on the medication.  Therefore she is stopped the medication herself.  She said when she checked her blood pressure at home usually 140s systolic.  I will give her amlodipine 5 mg daily and asked her to start taking this on the regular basis. ?Palpitations.  Look fairly benign however with her multiple risk factors for heart problem I will ask her to have Zio patch for 2 weeks to make sure she does not have any significant arrhythmia. ?Dyslipidemia we will check her fasting lipid profile today. ?Smoking she is perfectly aware of the harmful effect of this bad habit she is trying to do everything when she can to quit however she tells me that she struggled a lot.  We discussed potential technique how she can quit smoking. ? ? ?Medication Adjustments/Labs and Tests Ordered: ?Current medicines are reviewed at length with the patient today.  Concerns  regarding medicines are outlined above.  ?No orders of the defined types were placed in this encounter. ? ?No orders of the defined types were placed in this encounter. ? ? ?Signed, ?13/09/15, MD, [facility], [place]. ?07/14/2021 4:18 PM    ?[facility] ?

## 2021-07-14 NOTE — Patient Instructions (Signed)
Medication Instructions:  ?Your physician has recommended you make the following change in your medication:  ? ?Start Amlodipine 5 mg once daily ? ? ?*If you need a refill on your cardiac medications before your next appointment, please call your pharmacy* ? ? ?Lab Work: ?Your physician recommends that you return for lab work in: Today for Lipid Profile, AST and ALT ? ?If you have labs (blood work) drawn today and your tests are completely normal, you will receive your results only by: ?MyChart Message (if you have MyChart) OR ?A paper copy in the mail ?If you have any lab test that is abnormal or we need to change your treatment, we will call you to review the results. ? ? ?Testing/Procedures: ?Your physician has requested that you have an echocardiogram. Echocardiography is a painless test that uses sound waves to create images of your heart. It provides your doctor with information about the size and shape of your heart and how well your heart?s chambers and valves are working. This procedure takes approximately one hour. There are no restrictions for this procedure.  ? ?You have been asked to wear a Zio Heart Monitor. Please wear for 14 days and return to company in the box provided. If you have any questions or concerns with the monitor please call the company. ? ? ?Follow-Up: ?At [facility], you and your health needs are our priority.  As part of our continuing mission to provide you with exceptional heart care, we have created designated Provider Care Teams.  These Care Teams include your primary Cardiologist (physician) and Advanced Practice Providers (APPs -  Physician Assistants and Nurse Practitioners) who all work together to provide you with the care you need, when you need it. ? ?We recommend signing up for the patient portal called "MyChart".  Sign up information is provided on this After Visit Summary.  MyChart is used to connect with patients for Virtual Visits (Telemedicine).  Patients are able  to view lab/test results, encounter notes, upcoming appointments, etc.  Non-urgent messages can be sent to your provider as well.   ?To learn more about what you can do with MyChart, go to [email or web address].   ? ?Your next appointment:   ?2 month(s) ? ?The format for your next appointment:   ?In Person ? ?Provider:   ?Gypsy Balsam, MD  ? ? ?Other Instructions ? ? ?Important Information About Sugar ? ? ? ? ?  ?

## 2021-07-15 LAB — LIPID PANEL
Chol/HDL Ratio: 5.2 ratio — ABNORMAL HIGH (ref 0.0–4.4)
Cholesterol, Total: 296 mg/dL — ABNORMAL HIGH (ref 100–199)
HDL: 57 mg/dL (ref >39)
LDL Chol Calc (NIH): 225 mg/dL — ABNORMAL HIGH (ref 0–99)
Triglycerides: 84 mg/dL (ref 0–149)
VLDL Cholesterol Cal: 14 mg/dL (ref 5–40)

## 2021-07-15 LAB — AST: AST: 16 IU/L (ref 0–40)

## 2021-07-15 LAB — ALT: ALT: 17 IU/L (ref 0–32)

## 2021-07-19 ENCOUNTER — Telehealth: Payer: Self-pay

## 2021-07-19 DIAGNOSIS — E785 Hyperlipidemia, unspecified: Secondary | ICD-10-CM

## 2021-07-19 MED ORDER — ROSUVASTATIN CALCIUM 20 MG PO TABS: 20.0000 mg | ORAL_TABLET | Freq: Every day | ORAL | 0 refills | Status: DC

## 2021-07-19 NOTE — Telephone Encounter (Signed)
Results reviewed with pt as per Dr. Vanetta Shawl note. Will begin Crestor 20mg  daily and have Lipids, ALT, AST again in 6 weeks. Crestor sent to paharm. Lab req sent to lab. Pt verbalized understanding and had no additional questions. ?Routed to PCP ?

## 2021-07-27 ENCOUNTER — Other Ambulatory Visit: Payer: Managed Care, Other (non HMO)

## 2021-08-02 ENCOUNTER — Ambulatory Visit (INDEPENDENT_AMBULATORY_CARE_PROVIDER_SITE_OTHER): Payer: Managed Care, Other (non HMO)

## 2021-08-02 DIAGNOSIS — E785 Hyperlipidemia, unspecified: Secondary | ICD-10-CM

## 2021-08-02 DIAGNOSIS — R002 Palpitations: Secondary | ICD-10-CM

## 2021-08-02 DIAGNOSIS — F17200 Nicotine dependence, unspecified, uncomplicated: Secondary | ICD-10-CM

## 2021-08-02 DIAGNOSIS — I5030 Unspecified diastolic (congestive) heart failure: Secondary | ICD-10-CM

## 2021-08-02 DIAGNOSIS — I10 Essential (primary) hypertension: Secondary | ICD-10-CM

## 2021-08-02 DIAGNOSIS — R0609 Other forms of dyspnea: Secondary | ICD-10-CM | POA: Diagnosis not present

## 2021-08-02 LAB — ECHOCARDIOGRAM COMPLETE
Area-P 1/2: 3.39 cm2
S' Lateral: 3.1 cm

## 2021-08-05 ENCOUNTER — Telehealth: Payer: Self-pay | Admitting: Cardiology

## 2021-08-05 NOTE — Telephone Encounter (Signed)
Patient was returning call 

## 2021-08-23 ENCOUNTER — Telehealth: Payer: Self-pay

## 2021-08-23 MED ORDER — METOPROLOL TARTRATE 25 MG PO TABS: 12.5000 mg | ORAL_TABLET | Freq: Two times a day (BID) | ORAL | 2 refills | Status: DC

## 2021-08-23 NOTE — Telephone Encounter (Signed)
Patient notified of results and recommendations.

## 2021-08-23 NOTE — Telephone Encounter (Signed)
-----   Message from Park Liter, MD sent at 08/23/2021 10:48 AM EDT ----- Start metoprolol tartrate 12.5 mg twice daily that should help with palpitations and chest pain ----- Message ----- From: Darrel Reach, CMA Sent: 08/17/2021  12:32 PM EDT To: Park Liter, MD  Patient notified of results, she said she is feeling chest pain and rapid HR. Please advise of treatment ----- Message ----- From: Park Liter, MD Sent: 08/13/2021  12:26 PM EDT To: Tyler Pita, RN  Episode of supraventricular tachycardia, no need to treat unless people are symptomatic

## 2021-09-17 ENCOUNTER — Ambulatory Visit: Payer: Managed Care, Other (non HMO) | Admitting: Cardiology

## 2021-10-14 ENCOUNTER — Other Ambulatory Visit: Payer: Self-pay | Admitting: Cardiology

## 2021-10-15 MED ORDER — ROSUVASTATIN CALCIUM 20 MG PO TABS: 20.0000 mg | ORAL_TABLET | Freq: Every day | ORAL | 1 refills | Status: DC

## 2021-11-23 ENCOUNTER — Other Ambulatory Visit: Payer: Self-pay | Admitting: Cardiology

## 2021-11-23 MED ORDER — METOPROLOL TARTRATE 25 MG PO TABS
12.5000 mg | ORAL_TABLET | Freq: Two times a day (BID) | ORAL | 2 refills | Status: DC
Start: 2021-11-23 — End: 2022-02-28

## 2022-01-18 ENCOUNTER — Ambulatory Visit: Payer: Commercial Managed Care - HMO | Admitting: Cardiology

## 2022-02-24 ENCOUNTER — Other Ambulatory Visit: Payer: Self-pay | Admitting: Cardiology

## 2022-02-28 ENCOUNTER — Other Ambulatory Visit: Payer: Self-pay | Admitting: Cardiology

## 2022-03-15 ENCOUNTER — Ambulatory Visit: Payer: Commercial Managed Care - HMO | Admitting: Cardiology

## 2022-04-14 ENCOUNTER — Other Ambulatory Visit: Payer: Self-pay

## 2022-04-14 MED ORDER — ROSUVASTATIN CALCIUM 20 MG PO TABS: 20.0000 mg | ORAL_TABLET | Freq: Every day | ORAL | 0 refills | Status: DC

## 2022-06-29 ENCOUNTER — Other Ambulatory Visit: Payer: Self-pay | Admitting: Cardiology

## 2022-07-05 ENCOUNTER — Encounter: Payer: Self-pay | Admitting: Cardiology

## 2022-07-05 ENCOUNTER — Ambulatory Visit: Payer: Commercial Managed Care - HMO | Attending: Cardiology | Admitting: Cardiology

## 2022-07-05 VITALS — BP 162/100 | HR 65 | Ht 61.0 in | Wt 151.8 lb

## 2022-07-05 DIAGNOSIS — F17200 Nicotine dependence, unspecified, uncomplicated: Secondary | ICD-10-CM

## 2022-07-05 DIAGNOSIS — R002 Palpitations: Secondary | ICD-10-CM

## 2022-07-05 DIAGNOSIS — I10 Essential (primary) hypertension: Secondary | ICD-10-CM | POA: Diagnosis not present

## 2022-07-05 DIAGNOSIS — IMO0001 Reserved for inherently not codable concepts without codable children: Secondary | ICD-10-CM

## 2022-07-05 DIAGNOSIS — E785 Hyperlipidemia, unspecified: Secondary | ICD-10-CM

## 2022-07-05 DIAGNOSIS — M7989 Other specified soft tissue disorders: Secondary | ICD-10-CM

## 2022-07-05 DIAGNOSIS — R0609 Other forms of dyspnea: Secondary | ICD-10-CM

## 2022-07-05 NOTE — Addendum Note (Signed)
Addended by: Jacobo Forest D on: 07/05/2022 04:01 PM   Modules accepted: Orders

## 2022-07-05 NOTE — Progress Notes (Signed)
Cardiology Office Note:    Date:  07/05/2022   ID:  [name], DOB 1956-03-14, MRN [id number]  PCP:  Philmore Pali, NP (Inactive)  Cardiologist:  Jenne Campus, MD    Referring MD: Philmore Pali, NP   Chief Complaint  Patient presents with   Follow-up  I have swollen legs  History of Present Illness:    [name] is a 67 y.o. female past medical history significant for smoking, anxiety, nephrolithiasis, she was seen by [place] previously because of chest pain apparently stress test was done was negative then she came to [place] because of palpitations workup shows some short runs of supraventricular tachycardia but nothing dangerous echocardiogram was normal.  This time she comes to our office because of swelling of lower extremities which happened evening time.  She denies have any chest pain tightness squeezing pressure burning chest and swollen legs.  That been going on for about 2 to 3 months.  She lost her nephew that she really took care of him from childhood.  He did die because of drug addiction and overdose.  Past Medical History:  Diagnosis Date   Anxiety    Depressed    GERD (gastroesophageal reflux disease)    Caryl Comes syndrome    "degenerative bones"   Renal disorder    nephrolithiasis     Past Surgical History:  Procedure Laterality Date   ABDOMINAL HYSTERECTOMY     APPENDECTOMY     FOOT SURGERY     bunion   URETERAL STENT PLACEMENT      Current Medications: Current Meds  Medication Sig   amLODipine (NORVASC) 5 MG tablet Take 1 tablet by mouth once daily   Cyanocobalamin ([employer]S VITAMIN B12 PO) Take 1 tablet by mouth daily.   esomeprazole (NEXIUM) 20 MG capsule Take 20 mg by mouth daily at 12 noon.   metoprolol tartrate (LOPRESSOR) 25 MG tablet Take 0.5 tablets (12.5 mg total) by mouth 2 (two) times daily.   Probiotic Product (PROBIOTIC DAILY PO) Take 1 tablet by mouth daily.   rosuvastatin (CRESTOR) 20 MG tablet Take 1 tablet (20 mg total) by mouth daily.    Sennosides-Docusate Sodium (STOOL SOFTENER/LAXATIVE PO) Take 1 tablet by mouth as needed (Constipation).     Allergies:   Aspirin, Codeine, Morphine and related, and Nsaids   Social History   Socioeconomic History   Marital status: Married    Spouse name: Not on file   Number of children: Not on file   Years of education: Not on file   Highest education level: Not on file  Occupational History   Not on file  Tobacco Use   Smoking status: Every Day    Packs/day: 0.50    Years: 20.00    Additional pack years: 0.00    Total pack years: 10.00    Types: Cigarettes   Smokeless tobacco: Never  Substance and Sexual Activity   Alcohol use: No   Drug use: No   Sexual activity: Yes    Birth control/protection: Surgical  Other Topics Concern   Not on file  Social History Narrative   Not on file   Social Determinants of Health   Financial Resource Strain: Not on file  Food Insecurity: Not on file  Transportation Needs: Not on file  Physical Activity: Not on file  Stress: Not on file  Social Connections: Not on file     Family History: The patient's family history includes Heart attack in her sister; Heart murmur  in her brother, mother, and sister. ROS:   Please see the history of present illness.    All 14 point review of systems negative except as described per history of present illness  EKGs/Labs/Other Studies Reviewed:      Recent Labs: 07/14/2021: ALT 17  Recent Lipid Panel    Component Value Date/Time   CHOL 296 (H) 07/14/2021 1648   TRIG 84 07/14/2021 1648   HDL 57 07/14/2021 1648   CHOLHDL 5.2 (H) 07/14/2021 1648   LDLCALC 225 (H) 07/14/2021 1648    Physical Exam:    VS:  BP (!) 162/100 (BP Location: Left Arm)   Pulse 65   Ht 5\' 1"  (1.549 m)   Wt 151 lb 12.8 oz (68.9 kg)   SpO2 95%   BMI 28.68 kg/m     Wt Readings from Last 3 Encounters:  07/05/22 151 lb 12.8 oz (68.9 kg)  07/14/21 146 lb 3.2 oz (66.3 kg)  05/22/18 157 lb (71.2 kg)     GEN:   Well nourished, well developed in no acute distress HEENT: Normal NECK: No JVD; No carotid bruits LYMPHATICS: No lymphadenopathy CARDIAC: RRR, no murmurs, no rubs, no gallops RESPIRATORY:  Clear to auscultation without rales, wheezing or rhonchi  ABDOMEN: Soft, non-tender, non-distended MUSCULOSKELETAL:  No edema; No deformity  SKIN: Warm and dry LOWER EXTREMITIES: no swelling NEUROLOGIC:  Alert and oriented x 3 PSYCHIATRIC:  Normal affect   ASSESSMENT:    1. Leg swelling   2. Palpitations   3. Dyslipidemia   4. Essential hypertension   5. Smoking    PLAN:    In order of problems listed above:  Leg swelling but no proximal nocturnal dyspnea, shortness of breath is still there.  I will schedule her to have proBNP, echocardiogram will be done, I will also check Chem-7 and based on that decide if we can give her some small dose of diuretic to help with the swelling as well as with blood pressure. Palpitations: Denies having any Dyslipidemia did review K PN which show LDL 225.  Will check fasting the profile today we will continue Crestor Essential hypertension uncontrolled plan as described above   Medication Adjustments/Labs and Tests Ordered: Current medicines are reviewed at length with the patient today.  Concerns regarding medicines are outlined above.  No orders of the defined types were placed in this encounter.  Medication changes: No orders of the defined types were placed in this encounter.   Signed, Park Liter, MD, [facility] 07/05/2022 3:47 PM    [facility]

## 2022-07-05 NOTE — Patient Instructions (Addendum)
Medication Instructions:  Your physician recommends that you continue on your current medications as directed. Please refer to the Current Medication list given to you today.  *If you need a refill on your cardiac medications before your next appointment, please call your pharmacy*   Lab Work: BMP, ProBNP, Lipid, AST, ALT- today If you have labs (blood work) drawn today and your tests are completely normal, you will receive your results only by: [place] (if you have MyChart) OR A paper copy in the mail If you have any lab test that is abnormal or we need to change your treatment, we will call you to review the results.   Testing/Procedures: Your physician has requested that you have an echocardiogram. Echocardiography is a painless test that uses sound waves to create images of your heart. It provides your doctor with information about the size and shape of your heart and how well your heart's chambers and valves are working. This procedure takes approximately one hour. There are no restrictions for this procedure. Please do NOT wear cologne, perfume, aftershave, or lotions (deodorant is allowed). Please arrive 15 minutes prior to your appointment time.    Follow-Up: At [facility], you and your health needs are our priority.  As part of our continuing mission to provide you with exceptional heart care, we have created designated Provider Care Teams.  These Care Teams include your primary Cardiologist (physician) and Advanced Practice Providers (APPs -  Physician Assistants and Nurse Practitioners) who all work together to provide you with the care you need, when you need it.  We recommend signing up for the patient portal called "MyChart".  Sign up information is provided on this After Visit Summary.  MyChart is used to connect with patients for Virtual Visits (Telemedicine).  Patients are able to view lab/test results, encounter notes, upcoming appointments, etc.  Non-urgent  messages can be sent to your provider as well.   To learn more about what you can do with MyChart, go to [email or web address].    Your next appointment:   3 month(s)  The format for your next appointment:   In Person  Provider:   Jenne Campus, MD    Other Instructions NA

## 2022-07-06 LAB — BASIC METABOLIC PANEL
BUN/Creatinine Ratio: 15 (ref 12–28)
BUN: 9 mg/dL (ref 8–27)
CO2: 27 mmol/L (ref 20–29)
Calcium: 9.2 mg/dL (ref 8.7–10.3)
Chloride: 102 mmol/L (ref 96–106)
Creatinine, Ser: 0.59 mg/dL (ref 0.57–1.00)
Glucose: 93 mg/dL (ref 70–99)
Potassium: 4 mmol/L (ref 3.5–5.2)
Sodium: 141 mmol/L (ref 134–144)
eGFR: 101 mL/min/{1.73_m2} (ref >59)

## 2022-07-06 LAB — LIPID PANEL
Chol/HDL Ratio: 2.8 ratio (ref 0.0–4.4)
Cholesterol, Total: 143 mg/dL (ref 100–199)
HDL: 51 mg/dL (ref >39)
LDL Chol Calc (NIH): 78 mg/dL (ref 0–99)
Triglycerides: 71 mg/dL (ref 0–149)
VLDL Cholesterol Cal: 14 mg/dL (ref 5–40)

## 2022-07-06 LAB — PRO B NATRIURETIC PEPTIDE: NT-Pro BNP: 118 pg/mL (ref 0–287)

## 2022-07-06 LAB — ALT: ALT: 15 IU/L (ref 0–32)

## 2022-07-06 LAB — AST: AST: 15 IU/L (ref 0–40)

## 2022-07-07 ENCOUNTER — Telehealth: Payer: Self-pay

## 2022-07-07 NOTE — Telephone Encounter (Signed)
-----   Message from Park Liter, MD sent at 07/07/2022 11:04 AM EDT ----- Cholesterol looks good, liver function test are looking good, continue present management

## 2022-07-07 NOTE — Telephone Encounter (Signed)
Patient notified through my chart.

## 2022-07-08 ENCOUNTER — Other Ambulatory Visit: Payer: Self-pay | Admitting: Cardiology

## 2022-07-18 ENCOUNTER — Ambulatory Visit: Payer: Commercial Managed Care - HMO | Attending: Cardiology

## 2022-07-18 DIAGNOSIS — R0609 Other forms of dyspnea: Secondary | ICD-10-CM

## 2022-07-18 DIAGNOSIS — I361 Nonrheumatic tricuspid (valve) insufficiency: Secondary | ICD-10-CM | POA: Diagnosis not present

## 2022-07-18 LAB — ECHOCARDIOGRAM COMPLETE
P 1/2 time: 518 msec
S' Lateral: 3.3 cm

## 2022-07-29 ENCOUNTER — Telehealth: Payer: Self-pay

## 2022-07-29 NOTE — Telephone Encounter (Signed)
-----   Message from Georgeanna Lea, MD sent at 07/20/2022 10:32 AM EDT ----- Echocardiogram showed preserved left ventricle ejection fraction, overall looks good

## 2022-07-29 NOTE — Telephone Encounter (Signed)
Patient notified through my chart.

## 2022-08-26 ENCOUNTER — Other Ambulatory Visit: Payer: Self-pay | Admitting: Cardiology

## 2022-08-28 ENCOUNTER — Other Ambulatory Visit: Payer: Self-pay | Admitting: Cardiology

## 2022-08-30 NOTE — Telephone Encounter (Signed)
Rx refill sent to pharmacy. 

## 2023-02-24 ENCOUNTER — Other Ambulatory Visit: Payer: Self-pay | Admitting: Cardiology

## 2023-05-19 ENCOUNTER — Telehealth: Payer: Self-pay

## 2023-05-19 NOTE — Telephone Encounter (Signed)
[phone] Faxed [employer] Chronic Condition Release of Information form.

## 2023-05-21 NOTE — Progress Notes (Deleted)
  Cardiology Office Note:  .   Date:  05/21/2023  ID:  [name], DOB 1956/03/14, MRN [id number] PCP: Wendy Fear, NP (Inactive)  [facility] Providers Cardiologist:  None { Click to update primary MD,subspecialty MD or APP then REFRESH:1}   History of Present Illness: .   [name] is a 68 y.o. female with a past medical history of palpitations, SVT, atypical chest pain, tobacco abuse.  07/18/2022 echo EF 60 to 65%, no valvular abnormalities 07/14/2021 monitor average heart rate 73 bpm, predominant rhythm was sinus, 11 episodes of SVT 08/02/2021 echo EF 66 5%, grade 1 DD, no valvular abnormalities Most recently evaluated by Dr. Bing Matter on 07/05/2022, she was most bothered by pedal edema but she had no other associated symptoms.  Repeat echocardiogram was arranged and completed on 07/18/2022 revealing an EF of 60 to 65%  ROS: ROS   Studies Reviewed: .        *** Risk Assessment/Calculations:   {Does this patient have ATRIAL FIBRILLATION?:[phone]} No BP recorded.  {Refresh Note OR Click here to enter BP  :1}***       Physical Exam:   VS:  There were no vitals taken for this visit.   Wt Readings from Last 3 Encounters:  07/05/22 151 lb 12.8 oz (68.9 kg)  07/14/21 146 lb 3.2 oz (66.3 kg)  05/22/18 157 lb (71.2 kg)    GEN: Well nourished, well developed in no acute distress NECK: No JVD; No carotid bruits CARDIAC: ***RRR, no murmurs, rubs, gallops RESPIRATORY:  Clear to auscultation without rales, wheezing or rhonchi  ABDOMEN: Soft, non-tender, non-distended EXTREMITIES:  No edema; No deformity   ASSESSMENT AND PLAN: .   ***    {Are you ordering a CV Procedure (e.g. stress test, cath, DCCV, TEE, etc)?   Press F2        :[id number]}  Dispo: ***  Signed, Flossie Dibble, NP

## 2023-05-22 ENCOUNTER — Ambulatory Visit: Payer: Medicare Other | Admitting: Cardiology

## 2023-05-22 DIAGNOSIS — I10 Essential (primary) hypertension: Secondary | ICD-10-CM

## 2023-05-22 DIAGNOSIS — R002 Palpitations: Secondary | ICD-10-CM

## 2023-05-22 DIAGNOSIS — E785 Hyperlipidemia, unspecified: Secondary | ICD-10-CM

## 2023-05-26 ENCOUNTER — Telehealth: Payer: Self-pay

## 2023-05-26 NOTE — Telephone Encounter (Signed)
 LM informing the patient, refax [employer] form completed on 05/19/2023.

## 2023-06-23 ENCOUNTER — Other Ambulatory Visit: Payer: Self-pay | Admitting: Cardiology

## 2023-08-02 ENCOUNTER — Other Ambulatory Visit: Payer: Self-pay | Admitting: Cardiology

## 2023-08-10 ENCOUNTER — Encounter (HOSPITAL_COMMUNITY): Payer: Self-pay

## 2023-08-17 ENCOUNTER — Encounter: Payer: Self-pay | Admitting: Family Medicine

## 2023-08-17 ENCOUNTER — Ambulatory Visit (INDEPENDENT_AMBULATORY_CARE_PROVIDER_SITE_OTHER): Admitting: Family Medicine

## 2023-08-17 VITALS — BP 172/90 | HR 68 | Ht 61.0 in | Wt 154.8 lb

## 2023-08-17 DIAGNOSIS — Z1159 Encounter for screening for other viral diseases: Secondary | ICD-10-CM

## 2023-08-17 DIAGNOSIS — I10 Essential (primary) hypertension: Secondary | ICD-10-CM | POA: Diagnosis not present

## 2023-08-17 DIAGNOSIS — R0602 Shortness of breath: Secondary | ICD-10-CM

## 2023-08-17 DIAGNOSIS — E785 Hyperlipidemia, unspecified: Secondary | ICD-10-CM | POA: Diagnosis not present

## 2023-08-17 DIAGNOSIS — L304 Erythema intertrigo: Secondary | ICD-10-CM | POA: Diagnosis not present

## 2023-08-17 DIAGNOSIS — R7301 Impaired fasting glucose: Secondary | ICD-10-CM | POA: Diagnosis not present

## 2023-08-17 DIAGNOSIS — Z7689 Persons encountering health services in other specified circumstances: Secondary | ICD-10-CM

## 2023-08-17 DIAGNOSIS — M7989 Other specified soft tissue disorders: Secondary | ICD-10-CM | POA: Diagnosis not present

## 2023-08-17 DIAGNOSIS — F17200 Nicotine dependence, unspecified, uncomplicated: Secondary | ICD-10-CM

## 2023-08-17 DIAGNOSIS — F330 Major depressive disorder, recurrent, mild: Secondary | ICD-10-CM | POA: Diagnosis not present

## 2023-08-17 MED ORDER — BUPROPION HCL ER (SR) 150 MG PO TB12: 150.0000 mg | ORAL_TABLET | Freq: Two times a day (BID) | ORAL | 2 refills | Status: DC

## 2023-08-17 MED ORDER — BENAZEPRIL-HYDROCHLOROTHIAZIDE 10-12.5 MG PO TABS: 1.0000 | ORAL_TABLET | Freq: Every day | ORAL | 1 refills | Status: DC

## 2023-08-17 MED ORDER — NYSTATIN 100000 UNIT/GM EX CREA: 1.0000 | TOPICAL_CREAM | Freq: Two times a day (BID) | CUTANEOUS | 2 refills | Status: DC

## 2023-08-17 MED ORDER — NICOTINE 7 MG/24HR TD PT24: 7.0000 mg | MEDICATED_PATCH | Freq: Every day | TRANSDERMAL | 0 refills | Status: AC

## 2023-08-17 NOTE — Assessment & Plan Note (Addendum)
 Stopping amlodipine .  Starting hydrochlorothiazide and enalapril for blood pressure.  Follow-up 1 month.  Has had recent cardiac workup that was negative.

## 2023-08-17 NOTE — Assessment & Plan Note (Signed)
 Elevated today, stopping amlodipine  due to swelling, starting enalapril-HCTZ.  Check home blood pressure readings.  Come back in 1 month and discuss home readings.

## 2023-08-17 NOTE — Assessment & Plan Note (Signed)
 Does not tolerate higher doses of nicotine replacement patches other than 7 mg. - Sending in prescription for 7 mg patches - Sending in bupropion prescription.  Follow-up in 1 month. - Patient meets criteria of being a current smoker, over age 66, and have a history greater than 20 pack years (half a pack per day for the past 45 years.).  Will send in order for CT scan.

## 2023-08-17 NOTE — Patient Instructions (Addendum)
 It was nice to see you today,  We addressed the following topics today: -I sent in a new blood pressure medication for you.  This is a combination of benazepril and hydrochlorothiazide.  There is information about this medicine in your paperwork. - Stop taking the amlodipine , this is likely what is causing your leg swelling - I would let you know the results of your labs when I get them - I have sent in a antifungal cream to apply to the rash twice a day until it resolves.  If you need an additional cream you can apply Desitin as well. - Try to keep the area dry by using towels or switching bras if needed if you start to sweat. - I would recommend calling Medicare about what kind of therapy services they offer.  Once you find out who they cover, I can send in referral to that provider. - I would also recommend taking medication for this as well.  I would recommend bupropion.  I have provided some information on bupropion that you can read about. - I am sending in a referral for mammogram and I will look into seeing if you qualify for lung cancer screening. - I would recommend calling 1 800 quit NOW to see if they can get you nicotine replacement for free.  I will send in 7 mg patches.  Have a great day,  Etha Henle, MD

## 2023-08-17 NOTE — Progress Notes (Signed)
 New Patient Office Visit  Subjective   Patient ID: [name], female    DOB: Feb 20, 1956  Age: 68 y.o. MRN: [id number]  CC:  Chief Complaint  Patient presents with   New Patient (Initial Visit)    HPI CARMYN SPOONAMORE presents to establish care  Subjective - New patient visit, previously seen by Dr. Darin Edinger at [facility] - Also sees Dr. Nash Bade (urologist) for kidney stones, last episode approximately 1 year ago - Reports depression related to nephew's death from overdose 2 years ago - discussed antidepressants and therapy options - Reports difficulty sleeping - only slept 2 hours last night, gets up and down frequently - Reports rash in inframammary folds bilaterally - describes as "little bumps" that are "sore and red" - Has tried Monistat cream and powders without relief - Reports leg swelling since starting amlodipine   Medications: amlodipine  5mg  daily, rosuvastatin  20mg  daily, metoprolol  12.5mg  twice daily, esomeprazole daily, vitamin B12 daily, probiotic daily, occasional stool softener  PMH: hypertension, hyperlipidemia, kidney stones, depression PSH: hysterectomy, appendectomy, foot surgery twice, ruptured fallopian tube FH: parents with hypertension, extensive family history of diabetes and hypertension on both sides, niece with throat cancer Social 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   buPROPion (WELLBUTRIN SR) 150 MG 12 hr tablet Take 1 tablet (150 mg total) by mouth 2 (two) times daily. Take 1 tablet once a day for the first 4 days, then  increase to twice a day.   Cyanocobalamin ([employer] VITAMIN B12 PO) Take 1 tablet by mouth daily.   esomeprazole (NEXIUM) 20 MG capsule Take 20 mg by mouth daily at 12 noon.   metoprolol  tartrate (LOPRESSOR ) 25 MG tablet Take 0.5 tablets (12.5 mg total) by mouth 2 (two) times daily. 2nd attempt, patient needs an appt for additional refills   nicotine (NICODERM CQ - DOSED IN MG/24 HR) 7 mg/24hr patch Place 1 patch (7 mg total) onto the skin daily.   nystatin cream (MYCOSTATIN) Apply 1 Application topically 2 (two) times daily. Apply to rash 2 times daily for 2 weeks.   Probiotic Product (PROBIOTIC DAILY PO) Take 1 tablet by mouth daily.   rosuvastatin  (CRESTOR ) 20 MG tablet Take 1 tablet (20 mg total) by mouth daily. 2nd attempt, patient needs an appt for additional refills   Sennosides-Docusate Sodium (STOOL SOFTENER/LAXATIVE PO) Take 1 tablet by mouth as needed (Constipation).   [DISCONTINUED] amLODipine  (NORVASC ) 5 MG tablet Take 1 tablet (5 mg total) by mouth daily.   No facility-administered encounter medications on file as of 08/17/2023.    Past Medical History:  Diagnosis Date   Allergy    Anxiety    Arthritis    Depressed    GERD (gastroesophageal reflux disease)    Hyperlipidemia    Hypertension    Rodolfo Clan syndrome    "degenerative bones"   Renal disorder    nephrolithiasis     Past Surgical History:  Procedure Laterality Date   ABDOMINAL HYSTERECTOMY     APPENDECTOMY     FOOT SURGERY     bunion   URETERAL STENT PLACEMENT      Family  History  Problem Relation Age of Onset   Heart murmur Mother    Anxiety disorder Mother    Arthritis Mother    Asthma Mother    Depression Mother    Hyperlipidemia Mother    Hypertension Mother    Varicose Veins Mother    Heart murmur Sister    Heart attack Sister    Heart murmur Brother    Arthritis Father    Diabetes Father    Hyperlipidemia Father    Hypertension Father     Social History   Socioeconomic History   Marital  status: Married    Spouse name: Not on file   Number of children: Not on file   Years of education: Not on file   Highest education level: Some college, no degree  Occupational History   Not on file  Tobacco Use   Smoking status: Every Day    Current packs/day: 0.50    Average packs/day: 0.5 packs/day for 20.0 years (10.0 ttl pk-yrs)    Types: Cigarettes   Smokeless tobacco: Never  Substance and Sexual Activity   Alcohol use: No   Drug use: No   Sexual activity: Yes    Birth control/protection: Surgical, None  Other Topics Concern   Not on file  Social History Narrative   Not on file   Social Drivers of Health   Financial Resource Strain: Medium Risk (08/15/2023)   Overall Financial Resource Strain (CARDIA)    Difficulty of Paying Living Expenses: Somewhat hard  Food Insecurity: Food Insecurity Present (08/15/2023)   Hunger Vital Sign    Worried About Running Out of Food in the Last Year: Often true    Ran Out of Food in the Last Year: Often true  Transportation Needs: No Transportation Needs (08/15/2023)   PRAPARE - Administrator, Civil Service (Medical): No    Lack of Transportation (Non-Medical): No  Physical Activity: Insufficiently Active (08/15/2023)   Exercise Vital Sign    Days of Exercise per Week: 3 days    Minutes of Exercise per Session: 20 min  Stress: Stress Concern Present (08/15/2023)   [employer] of Occupational Health - Occupational Stress Questionnaire    Feeling of Stress : Very much  Social Connections: Unknown (08/15/2023)   Social Connection and Isolation Panel [NHANES]    Frequency of Communication with Friends and Family: Patient declined    Frequency of Social Gatherings with Friends and Family: Patient declined    Attends Religious Services: Patient declined    Database administrator or Organizations: No    Attends Engineer, structural: Not on file    Marital Status: Married  Catering manager Violence: Not on file     ROS     Objective   BP (!) 172/90   Pulse 68   Ht 5\' 1"  (1.549 m)   Wt 154 lb 12.8 oz (70.2 kg)   SpO2 98%   BMI 29.25 kg/m   Physical Exam General: Alert, oriented HEENT: PERRLA, EOMI, moist mucosa CV: Regular rate rhythm Pulmonary: Lungs clear bilaterally GI: Soft normal bowel sounds MSK: Trace edema bilaterally Skin: Multiple visible blood vessels of the lower extremities bilaterally.     Assessment & Plan:   Encounter to establish care  Essential hypertension Assessment & Plan: Elevated today, stopping amlodipine  due to swelling, starting enalapril-HCTZ.  Check home blood pressure readings.  Come back in 1 month and discuss home readings.  Orders: -     Comprehensive metabolic panel  with GFR -     TSH  Leg swelling Assessment & Plan: Stopping amlodipine .  Starting hydrochlorothiazide and enalapril for blood pressure.  Follow-up 1 month.  Has had recent cardiac workup that was negative.  Orders: -     CBC with Differential/Platelet  Dyslipidemia Assessment & Plan: Continue Crestor  5 mg.  Checking cholesterol level today.  Orders: -     Comprehensive metabolic panel with GFR -     Hemoglobin A1c -     Lipid panel  Shortness of breath  Smoker Assessment & Plan: Does not tolerate higher doses of nicotine replacement patches other than 7 mg. - Sending in prescription for 7 mg patches - Sending in bupropion prescription.  Follow-up in 1 month. - Patient meets criteria of being a current smoker, over age 75, and have a history greater than 20 pack years (half a pack per day for the past 45 years.).  Will send in order for CT scan.  Orders: -     CBC with Differential/Platelet  Encounter for hepatitis C screening test for low risk patient -     Hepatitis C antibody  Intertrigo Assessment & Plan: Mild bilateral under the breasts rash.  Prescribing nystatin cream twice daily.  Follow-up in 1 month.   Mild episode of recurrent major depressive  disorder [facility]) Assessment & Plan: Prescribing bupropion for this but also for smoking cessation.  Recommended patient reach out to Medicare in regards to coverage for therapy.  Recommended a combination of both medication and therapy.   Other orders -     Nystatin; Apply 1 Application topically 2 (two) times daily. Apply to rash 2 times daily for 2 weeks.  Dispense: 30 g; Refill: 2 -     Benazepril-hydroCHLOROthiazide; Take 1 tablet by mouth daily.  Dispense: 90 tablet; Refill: 1 -     Nicotine; Place 1 patch (7 mg total) onto the skin daily.  Dispense: 28 patch; Refill: 0 -     buPROPion HCl ER (SR); Take 1 tablet (150 mg total) by mouth 2 (two) times daily. Take 1 tablet once a day for the first 4 days, then increase to twice a day.  Dispense: 60 tablet; Refill: 2  I spent 45 minutes in the management this patient.  Return in about 4 weeks (around 09/14/2023) for HTN.   Laneta Pintos, MD

## 2023-08-17 NOTE — Assessment & Plan Note (Signed)
 Mild bilateral under the breasts rash.  Prescribing nystatin cream twice daily.  Follow-up in 1 month.

## 2023-08-17 NOTE — Assessment & Plan Note (Signed)
 Continue Crestor  5 mg.  Checking cholesterol level today.

## 2023-08-17 NOTE — Assessment & Plan Note (Signed)
 Prescribing bupropion for this but also for smoking cessation.  Recommended patient reach out to Medicare in regards to coverage for therapy.  Recommended a combination of both medication and therapy.

## 2023-08-18 ENCOUNTER — Ambulatory Visit: Payer: Self-pay | Admitting: Family Medicine

## 2023-08-18 LAB — HEMOGLOBIN A1C
Est. average glucose Bld gHb Est-mCnc: 128 mg/dL
Hgb A1c MFr Bld: 6.1 % — ABNORMAL HIGH (ref 4.8–5.6)

## 2023-08-18 LAB — COMPREHENSIVE METABOLIC PANEL WITH GFR
ALT: 22 IU/L (ref 0–32)
AST: 18 IU/L (ref 0–40)
Albumin: 4.5 g/dL (ref 3.9–4.9)
Alkaline Phosphatase: 105 IU/L (ref 44–121)
BUN/Creatinine Ratio: 16 (ref 12–28)
BUN: 11 mg/dL (ref 8–27)
Bilirubin Total: 0.3 mg/dL (ref 0.0–1.2)
CO2: 27 mmol/L (ref 20–29)
Calcium: 9 mg/dL (ref 8.7–10.3)
Chloride: 101 mmol/L (ref 96–106)
Creatinine, Ser: 0.68 mg/dL (ref 0.57–1.00)
Globulin, Total: 2.4 g/dL (ref 1.5–4.5)
Glucose: 95 mg/dL (ref 70–99)
Potassium: 3.7 mmol/L (ref 3.5–5.2)
Sodium: 141 mmol/L (ref 134–144)
Total Protein: 6.9 g/dL (ref 6.0–8.5)
eGFR: 97 mL/min/{1.73_m2} (ref >59)

## 2023-08-18 LAB — LIPID PANEL
Chol/HDL Ratio: 2.7 ratio (ref 0.0–4.4)
Cholesterol, Total: 141 mg/dL (ref 100–199)
HDL: 53 mg/dL (ref >39)
LDL Chol Calc (NIH): 76 mg/dL (ref 0–99)
Triglycerides: 56 mg/dL (ref 0–149)
VLDL Cholesterol Cal: 12 mg/dL (ref 5–40)

## 2023-08-18 LAB — CBC WITH DIFFERENTIAL/PLATELET
Basophils Absolute: 0.1 10*3/uL (ref 0.0–0.2)
Basos: 1 %
EOS (ABSOLUTE): 0.2 10*3/uL (ref 0.0–0.4)
Eos: 4 %
Hematocrit: 41.2 % (ref 34.0–46.6)
Hemoglobin: 13.9 g/dL (ref 11.1–15.9)
Immature Grans (Abs): 0 10*3/uL (ref 0.0–0.1)
Immature Granulocytes: 0 %
Lymphocytes Absolute: 2 10*3/uL (ref 0.7–3.1)
Lymphs: 36 %
MCH: 31.2 pg (ref 26.6–33.0)
MCHC: 33.7 g/dL (ref 31.5–35.7)
MCV: 92 fL (ref 79–97)
Monocytes Absolute: 0.4 10*3/uL (ref 0.1–0.9)
Monocytes: 8 %
Neutrophils Absolute: 2.9 10*3/uL (ref 1.4–7.0)
Neutrophils: 51 %
Platelets: 175 10*3/uL (ref 150–450)
RBC: 4.46 x10E6/uL (ref 3.77–5.28)
RDW: 12.7 % (ref 11.7–15.4)
WBC: 5.6 10*3/uL (ref 3.4–10.8)

## 2023-08-18 LAB — HEPATITIS C ANTIBODY: Hep C Virus Ab: NONREACTIVE

## 2023-08-18 LAB — TSH: TSH: 4.49 u[IU]/mL (ref 0.450–4.500)

## 2023-08-22 ENCOUNTER — Encounter: Payer: Self-pay | Admitting: Emergency Medicine

## 2023-08-22 ENCOUNTER — Ambulatory Visit: Payer: Self-pay

## 2023-08-22 ENCOUNTER — Other Ambulatory Visit: Payer: Self-pay | Admitting: Family Medicine

## 2023-08-22 ENCOUNTER — Other Ambulatory Visit: Payer: Self-pay

## 2023-08-22 ENCOUNTER — Ambulatory Visit
Admission: EM | Admit: 2023-08-22 | Discharge: 2023-08-22 | Disposition: A | Discharge status: Home / Self Care | Attending: Family Medicine | Admitting: Family Medicine

## 2023-08-22 DIAGNOSIS — R109 Unspecified abdominal pain: Secondary | ICD-10-CM | POA: Insufficient documentation

## 2023-08-22 LAB — POCT URINALYSIS DIP (MANUAL ENTRY)
Bilirubin, UA: NEGATIVE
Blood, UA: NEGATIVE
Glucose, UA: NEGATIVE mg/dL
Ketones, POC UA: NEGATIVE mg/dL
Nitrite, UA: NEGATIVE
Protein Ur, POC: NEGATIVE mg/dL
Spec Grav, UA: 1.015 (ref 1.010–1.025)
Urobilinogen, UA: 0.2 U/dL — AB
pH, UA: 7 (ref 5.0–8.0)

## 2023-08-22 MED ORDER — CIPROFLOXACIN HCL 250 MG PO TABS: 250.0000 mg | ORAL_TABLET | Freq: Two times a day (BID) | ORAL | 0 refills | Status: AC

## 2023-08-22 MED ORDER — KETOROLAC TROMETHAMINE 30 MG/ML IJ SOLN
15.0000 mg | Freq: Once | INTRAMUSCULAR | Status: AC
  Administered 2023-08-22: 15 mg via INTRAMUSCULAR

## 2023-08-22 MED ORDER — ROSUVASTATIN CALCIUM 20 MG PO TABS: 20.0000 mg | ORAL_TABLET | Freq: Every day | ORAL | 3 refills | Status: AC

## 2023-08-22 MED ORDER — TAMSULOSIN HCL 0.4 MG PO CAPS: 0.4000 mg | ORAL_CAPSULE | Freq: Every day | ORAL | 0 refills | Status: AC

## 2023-08-22 MED ORDER — KETOROLAC TROMETHAMINE 10 MG PO TABS: 10.0000 mg | ORAL_TABLET | Freq: Four times a day (QID) | ORAL | 0 refills | Status: DC | PRN

## 2023-08-22 NOTE — Discharge Instructions (Signed)
 The urinalysis showed some white blood cells.  You have been given a shot of Toradol 30 mg today.  Ketorolac 10 mg tablets--take 1 tablet every 6 hours as needed for pain.  This is the same medicine that is in the shot we just gave you  Take Cipro  500 mg--1 tablet 2 times daily for 7 days; this is the antibiotic  Tamsulosin 0.4 mg--1 daily.  This is to help urinary flow  Urine culture is sent, and staff will notify you that it looks like the antibiotic needs to be changed.  Please follow-up with your urologist.  If you worsen in any way, please go to the emergency room

## 2023-08-22 NOTE — Telephone Encounter (Signed)
 Copied from CRM [id number]. Topic: Clinical - Medical Advice >> Aug 22, 2023  8:58 AM [name] wrote: Reason for CRM: Pt has a UTI and seeking medical advice. Attempted to schedule, but next available isn't until July.   Chief Complaint: Urinary Symptoms-- Burning, Urgency Symptoms: burning, urgency, left flank pain, chills Frequency: 1 week ago Pertinent Negatives: Patient denies nausea, vomiting, diarrhea, difficulty breathing, chest pain Disposition: [] ED /[x] Urgent Care (no appt availability in office) / [] Appointment(In office/virtual)/ []  [facility]/ [] Home Care/ [] Refused Recommended Disposition /[] [place] [facility]/ []  Follow-up with PCP Additional Notes: Patient called and advised that she has been having urinary symptoms for about a week. She doesn't know about having a fever but states she had chills at one point. Patient denies any nausea, vomiting, diarrhea, difficulty breathing, chest pain.  Patient states history of kidney stones--last one was 1.5 years ago on her left side. Patient is having left flank pain.  Patient is advised that the recommendation is that she is evaluated by a healthcare provider in the next 24 hours. There are no available appointments at the patient's PCP office this month so patient is advised that Urgent Care would be an alternative. Patient states there is one she is familiar with and she will go to them for evaluation. Patient is also advised that if anything worsens to go to the Emergency Room. Patient verbalized understanding.  Reason for Disposition  Side (flank) or lower back pain present  Answer Assessment - Initial Assessment Questions 1. SYMPTOM: "What's the main symptom you're concerned about?" (e.g., frequency, incontinence)     Burning, urgency, left flank pain 2. ONSET: "When did the  urinary symptoms  start?"     1 week ago 3. PAIN: "Is there any pain?" If Yes, ask: "How bad is it?" (Scale: 1-10; mild, moderate,  severe)     9 4. CAUSE: "What do you think is causing the symptoms?"     Possible UTI 5. OTHER SYMPTOMS: "Do you have any other symptoms?" (e.g., blood in urine, fever, flank pain, pain with urination)     Left flank pain,  Protocols used: Urinary Symptoms-A-AH

## 2023-08-22 NOTE — ED Provider Notes (Addendum)
 [facility]    CSN: [id number] Arrival date & time: 08/22/23  1730      History   Chief Complaint Chief Complaint  Patient presents with   Flank Pain    HPI [name] is a 68 y.o. female.    Flank Pain  Here for left flank pain that has been going on for about 10 days.  The dysuria and urinary frequency has been intermittent.  The left flank pain has intensified in the last couple of days.  No fever or chills.  No nausea or vomiting, but her appetite is decreased some.  She is allergic to morphine and codeine, which causes itching.  Aspirin also causes hives and swelling.  NSAIDs such as ibuprofen and ibuprofen have caused rash, but she states emphatically that she has tolerated Toradol injections and tablets without problem.  She has had renal stones in the past, some large enough that she had to be admitted.  She has passed 1 in the last year at home.  Past Medical History:  Diagnosis Date   Allergy    Anxiety    Arthritis    Depressed    GERD (gastroesophageal reflux disease)    Hyperlipidemia    Hypertension    Rodolfo Clan syndrome    "degenerative bones"   Renal disorder    nephrolithiasis     Patient Active Problem List   Diagnosis Date Noted   Intertrigo 08/17/2023   Mild episode of recurrent major depressive disorder (HCC) 08/17/2023   Leg swelling 07/05/2022   Palpitations 07/14/2021   Irritable bowel syndrome with constipation 06/25/2020   Shortness of breath 06/05/2018   Essential hypertension 05/22/2018   Chest discomfort 05/22/2018   Dyslipidemia 05/22/2018   Smoker 05/22/2018    Past Surgical History:  Procedure Laterality Date   ABDOMINAL HYSTERECTOMY     APPENDECTOMY     FOOT SURGERY     bunion   URETERAL STENT PLACEMENT      OB History   No obstetric history on file.      Home Medications    Prior to Admission medications   Medication Sig Start Date End Date Taking? Authorizing Provider  ciprofloxacin  (CIPRO )  250 MG tablet Take 1 tablet (250 mg total) by mouth 2 (two) times daily for 7 days. 08/22/23 08/29/23 Yes Quintus Premo K, MD  ketorolac (TORADOL) 10 MG tablet Take 1 tablet (10 mg total) by mouth every 6 (six) hours as needed (pain). 08/22/23  Yes Alinda Egolf K, MD  tamsulosin (FLOMAX) 0.4 MG CAPS capsule Take 1 capsule (0.4 mg total) by mouth daily. 08/22/23  Yes Ann Keto, MD  benazepril -hydrochlorthiazide (LOTENSIN  HCT) 10-12.5 MG tablet Take 1 tablet by mouth daily. 08/17/23   Laneta Pintos, MD  buPROPion  (WELLBUTRIN  SR) 150 MG 12 hr tablet Take 1 tablet (150 mg total) by mouth 2 (two) times daily. Take 1 tablet once a day for the first 4 days, then increase to twice a day. 08/17/23   Laneta Pintos, MD  Cyanocobalamin ([employer] VITAMIN B12 PO) Take 1 tablet by mouth daily.    [provider]  esomeprazole (NEXIUM) 20 MG capsule Take 20 mg by mouth daily at 12 noon.    [provider]  metoprolol  tartrate (LOPRESSOR ) 25 MG tablet Take 0.5 tablets (12.5 mg total) by mouth 2 (two) times daily. 2nd attempt, patient needs an appt for additional refills 08/02/23   Krasowski, Robert J, MD  nicotine  (NICODERM CQ  - DOSED IN  MG/24 HR) 7 mg/24hr patch Place 1 patch (7 mg total) onto the skin daily. 08/17/23   Laneta Pintos, MD  nystatin  cream (MYCOSTATIN ) Apply 1 Application topically 2 (two) times daily. Apply to rash 2 times daily for 2 weeks. 08/17/23 10/01/23  Laneta Pintos, MD  Probiotic Product (PROBIOTIC DAILY PO) Take 1 tablet by mouth daily.    [provider]  rosuvastatin  (CRESTOR ) 20 MG tablet Take 1 tablet (20 mg total) by mouth daily. 2nd attempt, patient needs an appt for additional refills 08/22/23   Laneta Pintos, MD  Sennosides-Docusate Sodium (STOOL SOFTENER/LAXATIVE PO) Take 1 tablet by mouth as needed (Constipation).    [provider]    Family History Family History  Problem Relation Age of Onset   Heart murmur Mother    Anxiety  disorder Mother    Arthritis Mother    Asthma Mother    Depression Mother    Hyperlipidemia Mother    Hypertension Mother    Varicose Veins Mother    Heart murmur Sister    Heart attack Sister    Heart murmur Brother    Arthritis Father    Diabetes Father    Hyperlipidemia Father    Hypertension Father     Social History Social History   Tobacco Use   Smoking status: Every Day    Current packs/day: 0.50    Average packs/day: 0.5 packs/day for 20.0 years (10.0 ttl pk-yrs)    Types: Cigarettes   Smokeless tobacco: Never  Substance Use Topics   Alcohol use: No   Drug use: No     Allergies   Aspirin, Codeine, Morphine and codeine, and Nsaids   Review of Systems Review of Systems  Genitourinary:  Positive for flank pain.     Physical Exam Triage Vital Signs ED Triage Vitals [08/22/23 1750]  Encounter Vitals Group     BP (!) 175/71     Systolic BP Percentile      Diastolic BP Percentile      Pulse Rate 76     Resp 18     Temp 98 F (36.7 C)     Temp Source Oral     SpO2 96 %     Weight      Height      Head Circumference      Peak Flow      Pain Score 8     Pain Loc      Pain Education      Exclude from Growth Chart    No data found.  Updated Vital Signs BP (!) 175/71 (BP Location: Left Arm)   Pulse 76   Temp 98 F (36.7 C) (Oral)   Resp 18   SpO2 96%   Visual Acuity Right Eye Distance:   Left Eye Distance:   Bilateral Distance:    Right Eye Near:   Left Eye Near:    Bilateral Near:     Physical Exam Vitals reviewed.  Constitutional:      General: She is not in acute distress.    Appearance: She is not toxic-appearing.  HENT:     Mouth/Throat:     Mouth: Mucous membranes are moist.  Eyes:     Extraocular Movements: Extraocular movements intact.     Conjunctiva/sclera: Conjunctivae normal.     Pupils: Pupils are equal, round, and reactive to light.  Cardiovascular:     Rate and Rhythm: Normal rate and regular rhythm.     Heart  sounds: No murmur heard. Pulmonary:     Effort: Pulmonary effort is normal.     Breath sounds: Normal breath sounds.  Abdominal:     General: There is no distension.     Palpations: Abdomen is soft.     Tenderness: There is left CVA tenderness. There is no guarding.  Musculoskeletal:     Cervical back: Neck supple.  Lymphadenopathy:     Cervical: No cervical adenopathy.  Skin:    Capillary Refill: Capillary refill takes less than 2 seconds.     Coloration: Skin is not jaundiced or pale.  Neurological:     General: No focal deficit present.     Mental Status: She is alert and oriented to person, place, and time.  Psychiatric:        Behavior: Behavior normal.      UC Treatments / Results  Labs (all labs ordered are listed, but only abnormal results are displayed) Labs Reviewed  POCT URINALYSIS DIP (MANUAL ENTRY) - Abnormal; Notable for the following components:      Result Value   Urobilinogen, UA 0.2 (*)    Leukocytes, UA Small (1+) (*)    All other components within normal limits  URINE CULTURE    EKG   Radiology No results found.  Procedures Procedures (including critical care time)  Medications Ordered in UC Medications  ketorolac (TORADOL) 30 MG/ML injection 15 mg (15 mg Intramuscular Given 08/22/23 1808)    Initial Impression / Assessment and Plan / UC Course  I have reviewed the triage vital signs and the nursing notes.  Pertinent labs & imaging results that were available during my care of the patient were reviewed by me and considered in my medical decision making (see chart for details).     Urinalysis shows a small amount of leukocytes.  No red blood cells.  Urine culture is sent  I have verified that she can take Toradol without problem, and she states she has received an injection in the last year without trouble.  Toradol injections given here and ketorolac tablets are sent to the pharmacy, along with Cipro  for possible UTI and tamsulosin to help  urinary flow.  She will follow-up with her urologist.   Final Clinical Impressions(s) / UC Diagnoses   Final diagnoses:  Flank pain     Discharge Instructions      The urinalysis showed some white blood cells.  You have been given a shot of Toradol 30 mg today.  Ketorolac 10 mg tablets--take 1 tablet every 6 hours as needed for pain.  This is the same medicine that is in the shot we just gave you  Take Cipro  500 mg--1 tablet 2 times daily for 7 days; this is the antibiotic  Tamsulosin 0.4 mg--1 daily.  This is to help urinary flow  Urine culture is sent, and staff will notify you that it looks like the antibiotic needs to be changed.  Please follow-up with your urologist.  If you worsen in any way, please go to the emergency room    ED Prescriptions     Medication Sig Dispense Auth. Provider   ciprofloxacin  (CIPRO ) 250 MG tablet Take 1 tablet (250 mg total) by mouth 2 (two) times daily for 7 days. 14 tablet Riven Beebe K, MD   tamsulosin (FLOMAX) 0.4 MG CAPS capsule Take 1 capsule (0.4 mg total) by mouth daily. 10 capsule Ann Keto, MD   ketorolac (TORADOL) 10 MG tablet Take 1 tablet (10 mg total) by  mouth every 6 (six) hours as needed (pain). 20 tablet [place] Hoback, Paige Boatman, MD      I have reviewed the PDMP during this encounter.   Ann Keto, MD 08/22/23 Romona Cobb    Ann Keto, MD 08/22/23 [phone]

## 2023-08-22 NOTE — ED Triage Notes (Signed)
 Pt here for possible UTI; pt sts pain intermittent x 2 weeks with hx of kidney stones; pt sts left side pain currently

## 2023-08-23 ENCOUNTER — Telehealth: Payer: Self-pay

## 2023-08-23 ENCOUNTER — Encounter: Payer: Self-pay | Admitting: Emergency Medicine

## 2023-08-23 ENCOUNTER — Ambulatory Visit: Admission: EM | Admit: 2023-08-23 | Discharge: 2023-08-23 | Disposition: A | Discharge status: Home / Self Care

## 2023-08-23 ENCOUNTER — Encounter: Payer: Self-pay | Admitting: Family Medicine

## 2023-08-23 DIAGNOSIS — R300 Dysuria: Secondary | ICD-10-CM | POA: Diagnosis not present

## 2023-08-23 DIAGNOSIS — R112 Nausea with vomiting, unspecified: Secondary | ICD-10-CM | POA: Diagnosis not present

## 2023-08-23 MED ORDER — SULFAMETHOXAZOLE-TRIMETHOPRIM 800-160 MG PO TABS: 1.0000 | ORAL_TABLET | Freq: Two times a day (BID) | ORAL | 0 refills | Status: DC

## 2023-08-23 MED ORDER — SULFAMETHOXAZOLE-TRIMETHOPRIM 800-160 MG PO TABS: 1.0000 | ORAL_TABLET | Freq: Two times a day (BID) | ORAL | 0 refills | Status: AC

## 2023-08-23 NOTE — ED Provider Notes (Signed)
 [facility]    CSN: [id number] Arrival date & time: 08/23/23  1700      History   Chief Complaint Chief Complaint  Patient presents with   UTI    HPI [name] is a 68 y.o. female.   Patient presents requesting an alternate antibiotic - was prescribed Cipro  yesterday for a possible UTI.  She reports having taken two doses of it with subsequent nausea, vomiting, and feeling poorly all over.  Patient reports that she has done well with Bactrim in the past and is preferring this medication instead.  She still has some residual dysuria and urinary frequency.  States her left flank pain has improved since then.  Patient states she is experiencing chills, no measured elevated temperature.  The history is provided by the patient.    Past Medical History:  Diagnosis Date   Allergy    Anxiety    Arthritis    Depressed    GERD (gastroesophageal reflux disease)    Hyperlipidemia    Hypertension    Rodolfo Clan syndrome    "degenerative bones"   Renal disorder    nephrolithiasis     Patient Active Problem List   Diagnosis Date Noted   Intertrigo 08/17/2023   Mild episode of recurrent major depressive disorder (HCC) 08/17/2023   Leg swelling 07/05/2022   Palpitations 07/14/2021   Irritable bowel syndrome with constipation 06/25/2020   Shortness of breath 06/05/2018   Essential hypertension 05/22/2018   Chest discomfort 05/22/2018   Dyslipidemia 05/22/2018   Smoker 05/22/2018    Past Surgical History:  Procedure Laterality Date   ABDOMINAL HYSTERECTOMY     APPENDECTOMY     FOOT SURGERY     bunion   URETERAL STENT PLACEMENT      OB History   No obstetric history on file.      Home Medications    Prior to Admission medications   Medication Sig Start Date End Date Taking? Authorizing Provider  amLODipine  (NORVASC ) 5 MG tablet Take 5 mg by mouth daily. 08/20/23  Yes [provider]  FLUZONE HIGH-DOSE 0.5 ML injection  05/18/23  Yes [provider]  benazepril -hydrochlorthiazide (LOTENSIN  HCT) 10-12.5 MG tablet Take 1 tablet by mouth daily. 08/17/23   Laneta Pintos, MD  buPROPion  (WELLBUTRIN  SR) 150 MG 12 hr tablet Take 1 tablet (150 mg total) by mouth 2 (two) times daily. Take 1 tablet once a day for the first 4 days, then increase to twice a day. 08/17/23   Laneta Pintos, MD  ciprofloxacin  (CIPRO ) 250 MG tablet Take 1 tablet (250 mg total) by mouth 2 (two) times daily for 7 days. 08/22/23 08/29/23  Banister, Pamela K, MD  Cyanocobalamin ([facility] VITAMIN B12 PO) Take 1 tablet by mouth daily.    [provider]  esomeprazole (NEXIUM) 20 MG capsule Take 20 mg by mouth daily at 12 noon.    [provider]  ketorolac (TORADOL) 10 MG tablet Take 1 tablet (10 mg total) by mouth every 6 (six) hours as needed (pain). 08/22/23   Ann Keto, MD  metoprolol  tartrate (LOPRESSOR ) 25 MG tablet Take 0.5 tablets (12.5 mg total) by mouth 2 (two) times daily. 2nd attempt, patient needs an appt for additional refills 08/02/23   Krasowski, Robert J, MD  nicotine  (NICODERM CQ  - DOSED IN MG/24 HR) 7 mg/24hr patch Place 1 patch (7 mg total) onto the skin daily. 08/17/23   Laneta Pintos, MD  nystatin  cream (MYCOSTATIN ) Apply  1 Application topically 2 (two) times daily. Apply to rash 2 times daily for 2 weeks. 08/17/23 10/01/23  Laneta Pintos, MD  Probiotic Product (PROBIOTIC DAILY PO) Take 1 tablet by mouth daily.    [provider]  rosuvastatin  (CRESTOR ) 20 MG tablet Take 1 tablet (20 mg total) by mouth daily. 2nd attempt, patient needs an appt for additional refills 08/22/23   Laneta Pintos, MD  Sennosides-Docusate Sodium (STOOL SOFTENER/LAXATIVE PO) Take 1 tablet by mouth as needed (Constipation).    [provider]  sulfamethoxazole-trimethoprim (BACTRIM DS) 800-160 MG tablet Take 1 tablet by mouth 2 (two) times daily for 7 days. 08/23/23 08/30/23  Genene Kennel, FNP  tamsulosin (FLOMAX) 0.4 MG CAPS capsule Take  1 capsule (0.4 mg total) by mouth daily. 08/22/23   Ann Keto, MD    Family History Family History  Problem Relation Age of Onset   Heart murmur Mother    Anxiety disorder Mother    Arthritis Mother    Asthma Mother    Depression Mother    Hyperlipidemia Mother    Hypertension Mother    Varicose Veins Mother    Heart murmur Sister    Heart attack Sister    Heart murmur Brother    Arthritis Father    Diabetes Father    Hyperlipidemia Father    Hypertension Father     Social History Social History   Tobacco Use   Smoking status: Every Day    Current packs/day: 0.50    Average packs/day: 0.5 packs/day for 20.0 years (10.0 ttl pk-yrs)    Types: Cigarettes   Smokeless tobacco: Never  Vaping Use   Vaping status: Never Used  Substance Use Topics   Alcohol use: No   Drug use: No     Allergies   Ciprofloxacin , Aspirin, Codeine, Morphine and codeine, and Nsaids   Review of Systems Review of Systems  Constitutional:  Positive for chills. Negative for fever.  HENT:  Negative for congestion, rhinorrhea and sore throat.   Eyes:  Negative for pain and redness.  Respiratory:  Negative for cough and shortness of breath.   Cardiovascular:  Negative for chest pain and palpitations.  Gastrointestinal:  Negative for abdominal pain, diarrhea and vomiting.  Genitourinary:  Positive for dysuria, flank pain and frequency.  Musculoskeletal:  Negative for back pain and myalgias.  Skin:  Negative for rash.  Neurological:  Negative for dizziness and headaches.     Physical Exam Triage Vital Signs ED Triage Vitals  Encounter Vitals Group     BP 08/23/23 1818 (!) 154/83     Systolic BP Percentile --      Diastolic BP Percentile --      Pulse Rate 08/23/23 1818 74     Resp 08/23/23 1818 18     Temp 08/23/23 1818 97.7 F (36.5 C)     Temp Source 08/23/23 1818 Oral     SpO2 08/23/23 1818 96 %     Weight 08/23/23 1817 154 lb 12.2 oz (70.2 kg)     Height --      Head  Circumference --      Peak Flow --      Pain Score 08/23/23 1813 9     Pain Loc --      Pain Education --      Exclude from Growth Chart --    No data found.  Updated Vital Signs BP (!) 154/83 (BP Location: Left Arm)   Pulse 74  Temp 97.7 F (36.5 C) (Oral)   Resp 18   Wt 154 lb 12.2 oz (70.2 kg)   SpO2 96%   BMI 29.24 kg/m   Visual Acuity Right Eye Distance:   Left Eye Distance:   Bilateral Distance:    Right Eye Near:   Left Eye Near:    Bilateral Near:     Physical Exam Vitals and nursing note reviewed.  Constitutional:      Appearance: Normal appearance.  HENT:     Head: Normocephalic.  Cardiovascular:     Rate and Rhythm: Normal rate and regular rhythm.     Heart sounds: Normal heart sounds.  Pulmonary:     Effort: Pulmonary effort is normal.     Breath sounds: Normal breath sounds.  Abdominal:     General: Bowel sounds are normal.  Skin:    General: Skin is warm and dry.  Neurological:     General: No focal deficit present.     Mental Status: She is alert and oriented to person, place, and time.  Psychiatric:        Mood and Affect: Mood normal.        Behavior: Behavior normal.        Thought Content: Thought content normal.        Judgment: Judgment normal.    UC Treatments / Results  Labs (all labs ordered are listed, but only abnormal results are displayed) Labs Reviewed - No data to display  EKG   Radiology No results found.  Procedures Procedures (including critical care time)  Medications Ordered in UC Medications - No data to display  Initial Impression / Assessment and Plan / UC Course  I have reviewed the triage vital signs and the nursing notes.  Pertinent labs & imaging results that were available during my care of the patient were reviewed by me and considered in my medical decision making (see chart for details).     Patient presenting for evaluation of nausea, vomiting, and fatigue after starting Cipro  - unclear if  this is related to the medication or her overall illness.  Regardless, patient states she tolerates Bactrim well and is requesting to switch.  Reasonable to stop the Cipro  and a new prescription has been sent as requested.  Ensure adequate oral hydration and follow up with urology as recommended.  Offered Zofran  prescription for nausea/vomiting but patient states she is doing better now.  Final Clinical Impressions(s) / UC Diagnoses   Final diagnoses:  Dysuria  Nausea and vomiting, unspecified vomiting type     Discharge Instructions      You may discontinue the Cipro . A prescription for Bactrim will be sent to the pharmacy. Ensure adequate oral hydration. Follow up with urology as recommended.   ED Prescriptions     Medication Sig Dispense Auth. Provider   sulfamethoxazole-trimethoprim (BACTRIM DS) 800-160 MG tablet  (Status: Discontinued) Take 1 tablet by mouth 2 (two) times daily for 7 days. 14 tablet Cambren Helm J, FNP   sulfamethoxazole-trimethoprim (BACTRIM DS) 800-160 MG tablet Take 1 tablet by mouth 2 (two) times daily for 7 days. 14 tablet Genene Kennel, FNP      PDMP not reviewed this encounter.   Genene Kennel, FNP 08/23/23 [phone]

## 2023-08-23 NOTE — ED Triage Notes (Signed)
 Pt c/o UTI sxs x 2 weeks. Pt was already tested and treated for UTI on 5/20 but she has an allergy to the med that was prescribed.

## 2023-08-23 NOTE — Discharge Instructions (Signed)
 You may discontinue the Cipro . A prescription for Bactrim will be sent to the pharmacy. Ensure adequate oral hydration. Follow up with urology as recommended.

## 2023-08-25 LAB — URINE CULTURE: Culture: >=100000 — AB

## 2023-08-28 ENCOUNTER — Ambulatory Visit (HOSPITAL_COMMUNITY): Payer: Self-pay

## 2023-09-18 ENCOUNTER — Ambulatory Visit: Admitting: Family Medicine

## 2023-09-20 NOTE — Telephone Encounter (Signed)
Err  r

## 2023-09-26 ENCOUNTER — Telehealth: Payer: Self-pay | Admitting: Family Medicine

## 2023-09-26 MED ORDER — AMLODIPINE BESYLATE 5 MG PO TABS: 5.0000 mg | ORAL_TABLET | Freq: Every day | ORAL | 1 refills | Status: DC

## 2023-09-26 MED ORDER — METOPROLOL TARTRATE 25 MG PO TABS: 12.5000 mg | ORAL_TABLET | Freq: Two times a day (BID) | ORAL | 1 refills | Status: AC

## 2023-09-26 NOTE — Telephone Encounter (Unsigned)
 Copied from CRM [id number]. Topic: Clinical - Medication Refill >> Sep 26, 2023  2:58 PM Carlatta H wrote: Medication: metoprolol  tartrate (LOPRESSOR ) 25 MG table  amLODipine  (NORVASC ) 5 MG tablet     Has the patient contacted their pharmacy? No (Agent: If no, request that the patient contact the pharmacy for the refill. If patient does not wish to contact the pharmacy document the reason why and proceed with request.) (Agent: If yes, when and what did the pharmacy advise?)  This is the patient's preferred pharmacy:  [facility] - [place], [place] - [address] [place] [address] Phone: [phone] Fax: [phone]    Is this the correct pharmacy for this prescription? Yes If no, delete pharmacy and type the correct one.   Has the prescription been filled recently? No  Is the patient out of the medication? Yes  Has the patient been seen for an appointment in the last year OR does the patient have an upcoming appointment? Yes  Can we respond through MyChart? Yes  Agent: Please be advised that Rx refills may take up to 3 business days. We ask that you follow-up with your pharmacy.

## 2023-11-17 ENCOUNTER — Ambulatory Visit: Admitting: Family Medicine

## 2024-01-22 ENCOUNTER — Other Ambulatory Visit: Payer: Self-pay | Admitting: Family Medicine

## 2024-03-25 NOTE — Progress Notes (Signed)
 Pharmacy Quality Measure Review  This patient is appearing on a report for being at risk of failing the adherence measure for cholesterol (statin) medications this calendar year.   Medication: Rosuvastatin  20 mg Last fill date: 02/26/24 for 90 day supply  Insurance report was not up to date. No action needed at this time.   Jenkins Graces, PharmD PGY1 Pharmacy Resident

## 2024-03-29 ENCOUNTER — Ambulatory Visit: Admission: EM | Admit: 2024-03-29 | Discharge: 2024-03-29 | Disposition: A | Discharge status: Home / Self Care

## 2024-03-29 VITALS — BP 146/85 | HR 74 | Temp 98.3°F | Resp 18 | Wt 154.8 lb

## 2024-03-29 DIAGNOSIS — S161XXA Strain of muscle, fascia and tendon at neck level, initial encounter: Secondary | ICD-10-CM | POA: Insufficient documentation

## 2024-03-29 DIAGNOSIS — N3001 Acute cystitis with hematuria: Secondary | ICD-10-CM | POA: Diagnosis not present

## 2024-03-29 DIAGNOSIS — X58XXXA Exposure to other specified factors, initial encounter: Secondary | ICD-10-CM | POA: Insufficient documentation

## 2024-03-29 DIAGNOSIS — R300 Dysuria: Secondary | ICD-10-CM | POA: Insufficient documentation

## 2024-03-29 DIAGNOSIS — M436 Torticollis: Secondary | ICD-10-CM | POA: Diagnosis present

## 2024-03-29 LAB — POCT URINE DIPSTICK
Bilirubin, UA: NEGATIVE
Glucose, UA: NEGATIVE mg/dL
Ketones, POC UA: NEGATIVE mg/dL
Nitrite, UA: POSITIVE — AB
Spec Grav, UA: 1.02
Urobilinogen, UA: 1 U/dL
pH, UA: 6.5

## 2024-03-29 MED ORDER — METHYLPREDNISOLONE 4 MG PO TBPK: ORAL_TABLET | ORAL | 0 refills | Status: DC

## 2024-03-29 MED ORDER — ACETAMINOPHEN 325 MG PO TABS
975.0000 mg | ORAL_TABLET | Freq: Once | ORAL | Status: AC
  Administered 2024-03-29: 975 mg via ORAL

## 2024-03-29 MED ORDER — SULFAMETHOXAZOLE-TRIMETHOPRIM 800-160 MG PO TABS: 1.0000 | ORAL_TABLET | Freq: Two times a day (BID) | ORAL | 0 refills | Status: AC

## 2024-03-29 MED ORDER — TIZANIDINE HCL 4 MG PO TABS: 4.0000 mg | ORAL_TABLET | Freq: Four times a day (QID) | ORAL | 0 refills | Status: AC | PRN

## 2024-03-29 MED ORDER — DEXAMETHASONE 10 MG/ML FOR PEDIATRIC ORAL USE
10.0000 mg | Freq: Once | INTRAMUSCULAR | Status: AC
  Administered 2024-03-29: 10 mg via ORAL

## 2024-03-29 NOTE — ED Provider Notes (Addendum)
 " [facility]    CSN: [id number] Arrival date & time: 03/29/24  1452      History   Chief Complaint Chief Complaint  Patient presents with   Torticollis    HPI [name] is a 68 y.o. female.   Patient presents today due to right-sided neck pain for the last 3 to 4 days.  Patient denies use of medicine for symptoms.  Patient denies fever, chills, nausea, vomiting.  Patient also states that she has been experiencing urinary frequency, dysuria, and lower back pain for the last 7 days.  The history is provided by the patient.    Past Medical History:  Diagnosis Date   Allergy    Anxiety    Arthritis    Depressed    GERD (gastroesophageal reflux disease)    Hyperlipidemia    Hypertension    Klein syndrome    degenerative bones   Renal disorder    nephrolithiasis     Patient Active Problem List   Diagnosis Date Noted   Intertrigo 08/17/2023   Mild episode of recurrent major depressive disorder 08/17/2023   Leg swelling 07/05/2022   Palpitations 07/14/2021   Irritable bowel syndrome with constipation 06/25/2020   Shortness of breath 06/05/2018   Essential hypertension 05/22/2018   Chest discomfort 05/22/2018   Dyslipidemia 05/22/2018   Smoker 05/22/2018    Past Surgical History:  Procedure Laterality Date   ABDOMINAL HYSTERECTOMY     APPENDECTOMY     FOOT SURGERY     bunion   URETERAL STENT PLACEMENT      OB History   No obstetric history on file.      Home Medications    Prior to Admission medications  Medication Sig Start Date End Date Taking? Authorizing Provider  lubiprostone (AMITIZA) 8 MCG capsule 1 capsule with food and water Orally Twice a day; Duration: 30 day(s) 06/12/19  Yes [provider]  methylPREDNISolone  (MEDROL  DOSEPAK) 4 MG TBPK tablet Take as directed on the back of the package 03/29/24  Yes Andra Krabbe C, PA-Ayala  sulfamethoxazole -trimethoprim  (BACTRIM  DS) 800-160 MG tablet Take 1 tablet by mouth 2  (two) times daily for 5 days. 03/29/24 04/03/24 Yes Andra Krabbe BROCKS, PA-Ayala  tiZANidine  (ZANAFLEX ) 4 MG tablet Take 1 tablet (4 mg total) by mouth every 6 (six) hours as needed for muscle spasms. 03/29/24  Yes Andra Krabbe C, PA-Ayala  amLODipine  (NORVASC ) 5 MG tablet Take 1 tablet (5 mg total) by mouth daily. 09/26/23   Gayle Saddie FALCON, PA-Ayala  benazepril -hydrochlorthiazide (LOTENSIN  HCT) 10-12.5 MG tablet Take 1 tablet by mouth daily. 08/17/23   Chandra Toribio POUR, MD  buPROPion  (WELLBUTRIN  SR) 150 MG 12 hr tablet Take 1 tablet (150 mg total) by mouth 2 (two) times daily. Take 1 tablet once a day for the first 4 days, then increase to twice a day. 08/17/23   Chandra Toribio POUR, MD  Cyanocobalamin ([facility] VITAMIN B12 PO) Take 1 tablet by mouth daily.    [provider]  esomeprazole (NEXIUM) 20 MG capsule Take 20 mg by mouth daily at 12 noon.    [provider]  FLUZONE HIGH-DOSE 0.5 ML injection  05/18/23   [provider]  ketorolac  (TORADOL ) 10 MG tablet Take 1 tablet (10 mg total) by mouth every 6 (six) hours as needed (pain). 08/22/23   Vonna Sharlet POUR, MD  metoprolol  tartrate (LOPRESSOR ) 25 MG tablet Take 0.5 tablets (12.5 mg total) by mouth 2 (two) times daily. 2nd attempt,  patient needs an appt for additional refills 09/26/23   Gayle Saddie FALCON, PA-Ayala  nicotine  (NICODERM CQ  - DOSED IN MG/24 HR) 7 mg/24hr patch Place 1 patch (7 mg total) onto the skin daily. 08/17/23   Chandra Toribio POUR, MD  nystatin  cream (MYCOSTATIN ) APPLY TO AFFECTED RASH AREA ON THE SKIN 2 TIMES DAILY FOR 2 WEEKS. 01/22/24   Chandra Toribio POUR, MD  Probiotic Product (PROBIOTIC DAILY PO) Take 1 tablet by mouth daily.    [provider]  rosuvastatin  (CRESTOR ) 20 MG tablet Take 1 tablet (20 mg total) by mouth daily. 2nd attempt, patient needs an appt for additional refills 08/22/23   Chandra Toribio POUR, MD  Sennosides-Docusate Sodium (STOOL SOFTENER/LAXATIVE PO) Take 1 tablet by mouth as needed (Constipation).     [provider]  tamsulosin  (FLOMAX ) 0.4 MG CAPS capsule Take 1 capsule (0.4 mg total) by mouth daily. 08/22/23   Vonna Sharlet POUR, MD  telmisartan  (MICARDIS ) 40 MG tablet 1 tablet Orally Once a day; Duration: 30 day(s)    [provider]    Family History Family History  Problem Relation Age of Onset   Heart murmur Mother    Anxiety disorder Mother    Arthritis Mother    Asthma Mother    Depression Mother    Hyperlipidemia Mother    Hypertension Mother    Varicose Veins Mother    Heart murmur Sister    Heart attack Sister    Heart murmur Brother    Arthritis Father    Diabetes Father    Hyperlipidemia Father    Hypertension Father     Social History Social History[1]   Allergies   Ciprofloxacin , Aspirin, Codeine, Morphine, Morphine and codeine, and Nsaids   Review of Systems Review of Systems   Physical Exam Triage Vital Signs ED Triage Vitals  Encounter Vitals Group     BP 03/29/24 1531 (!) 146/85     Girls Systolic BP Percentile --      Girls Diastolic BP Percentile --      Boys Systolic BP Percentile --      Boys Diastolic BP Percentile --      Pulse Rate 03/29/24 1531 74     Resp 03/29/24 1531 18     Temp 03/29/24 1531 98.3 F (36.8 Ayala)     Temp Source 03/29/24 1531 Oral     SpO2 03/29/24 1531 95 %     Weight 03/29/24 1530 154 lb 12.2 oz (70.2 kg)     Height --      Head Circumference --      Peak Flow --      Pain Score 03/29/24 1529 10     Pain Loc --      Pain Education --      Exclude from Growth Chart --    No data found.  Updated Vital Signs BP (!) 146/85 (BP Location: Left Arm)   Pulse 74   Temp 98.3 F (36.8 Ayala) (Oral)   Resp 18   Wt 154 lb 12.2 oz (70.2 kg)   SpO2 95%   BMI 29.24 kg/m   Visual Acuity Right Eye Distance:   Left Eye Distance:   Bilateral Distance:    Right Eye Near:   Left Eye Near:    Bilateral Near:     Physical Exam Vitals and nursing note reviewed.  Constitutional:      General: She  is not in acute distress.    Appearance: Normal appearance. She  is not ill-appearing, toxic-appearing or diaphoretic.  Eyes:     General: No scleral icterus. Neck:      Comments: Reduced ROM of neck.  Cardiovascular:     Rate and Rhythm: Normal rate and regular rhythm.     Heart sounds: Normal heart sounds.  Pulmonary:     Effort: Pulmonary effort is normal. No respiratory distress.     Breath sounds: Normal breath sounds. No wheezing or rhonchi.  Abdominal:     General: Abdomen is flat. Bowel sounds are normal.     Palpations: Abdomen is soft.     Tenderness: There is abdominal tenderness in the suprapubic area. There is no right CVA tenderness or left CVA tenderness.  Musculoskeletal:     Cervical back: Torticollis present. Pain with movement and muscular tenderness present. No spinous process tenderness.     Comments: Reduced   Skin:    General: Skin is warm.  Neurological:     Mental Status: She is alert and oriented to person, place, and time.  Psychiatric:        Mood and Affect: Mood normal.        Behavior: Behavior normal.      UC Treatments / Results  Labs (all labs ordered are listed, but only abnormal results are displayed) Labs Reviewed  POCT URINE DIPSTICK - Abnormal; Notable for the following components:      Result Value   Color, UA other (*)    Blood, UA trace-intact (*)    Nitrite, UA Positive (*)    Leukocytes, UA Trace (*)    All other components within normal limits  URINE CULTURE    EKG   Radiology No results found.  Procedures Procedures (including critical care time)  Medications Ordered in UC Medications  acetaminophen  (TYLENOL ) tablet 975 mg (975 mg Oral Given 03/29/24 1621)  dexamethasone  (DECADRON ) 10 MG/ML injection for Pediatric ORAL use 10 mg (10 mg Oral Given 03/29/24 1620)    Initial Impression / Assessment and Plan / UC Course  I have reviewed the triage vital signs and the nursing notes.  Pertinent labs & imaging  results that were available during my care of the patient were reviewed by me and considered in my medical decision making (see chart for details).     Final Clinical Impressions(s) / UC Diagnoses   Final diagnoses:  Dysuria  Acute cystitis with hematuria  Cervical strain, acute, initial encounter   Discharge Instructions   None    ED Prescriptions     Medication Sig Dispense Auth. Provider   methylPREDNISolone  (MEDROL  DOSEPAK) 4 MG TBPK tablet Take as directed on the back of the package 21 tablet Andra Krabbe C, PA-Ayala   sulfamethoxazole -trimethoprim  (BACTRIM  DS) 800-160 MG tablet Take 1 tablet by mouth 2 (two) times daily for 5 days. 10 tablet Wendy Zaremba C, PA-Ayala   tiZANidine  (ZANAFLEX ) 4 MG tablet Take 1 tablet (4 mg total) by mouth every 6 (six) hours as needed for muscle spasms. 30 tablet Andra Krabbe BROCKS, PA-Ayala      PDMP not reviewed this encounter.    Andra Krabbe BROCKS, PA-Ayala 03/29/24 1653     [1]  Social History Tobacco Use   Smoking status: Every Day    Current packs/day: 0.50    Average packs/day: 0.5 packs/day for 20.0 years (10.0 ttl pk-yrs)    Types: Cigarettes    Passive exposure: Past   Smokeless tobacco: Never  Vaping Use   Vaping status: Never Used  Substance Use  Topics   Alcohol use: No   Drug use: No     Andra Corean BROCKS, PA-Ayala 03/29/24 1654  "

## 2024-03-29 NOTE — ED Triage Notes (Signed)
 Pt presents c/o neck pain and UTI sxs x 7 days. Pt states,  When I turn my neck it feels like a hot poker in my R ear. I also think I have a UTI. My back also hurts and I have frequent urination and fatigue from not sleeping.

## 2024-04-02 ENCOUNTER — Ambulatory Visit (HOSPITAL_COMMUNITY): Payer: Self-pay

## 2024-04-02 LAB — URINE CULTURE: Culture: >=100000 — AB

## 2024-04-02 MED ORDER — NITROFURANTOIN MONOHYD MACRO 100 MG PO CAPS: 100.0000 mg | ORAL_CAPSULE | Freq: Two times a day (BID) | ORAL | 0 refills | Status: DC

## 2024-04-09 ENCOUNTER — Other Ambulatory Visit: Payer: Self-pay

## 2024-04-10 NOTE — Telephone Encounter (Signed)
 Pt husband called in to check the status of med refill. I informed him that his wife needs an appt and we scheduled one for Monday afternoon.

## 2024-04-15 ENCOUNTER — Ambulatory Visit: Admitting: Family Medicine

## 2024-04-18 ENCOUNTER — Ambulatory Visit (INDEPENDENT_AMBULATORY_CARE_PROVIDER_SITE_OTHER): Admitting: Family Medicine

## 2024-04-18 ENCOUNTER — Encounter: Payer: Self-pay | Admitting: Family Medicine

## 2024-04-18 VITALS — BP 149/77 | HR 72 | Ht 61.0 in | Wt 149.0 lb

## 2024-04-18 DIAGNOSIS — L821 Other seborrheic keratosis: Secondary | ICD-10-CM | POA: Diagnosis not present

## 2024-04-18 DIAGNOSIS — M7989 Other specified soft tissue disorders: Secondary | ICD-10-CM | POA: Diagnosis not present

## 2024-04-18 DIAGNOSIS — Z23 Encounter for immunization: Secondary | ICD-10-CM

## 2024-04-18 DIAGNOSIS — I10 Essential (primary) hypertension: Secondary | ICD-10-CM | POA: Diagnosis not present

## 2024-04-18 DIAGNOSIS — M542 Cervicalgia: Secondary | ICD-10-CM

## 2024-04-18 DIAGNOSIS — L304 Erythema intertrigo: Secondary | ICD-10-CM | POA: Diagnosis not present

## 2024-04-18 MED ORDER — INDAPAMIDE 1.25 MG PO TABS: 1.2500 mg | ORAL_TABLET | Freq: Every day | ORAL | 2 refills | Status: AC

## 2024-04-18 NOTE — Progress Notes (Signed)
 "  Established Patient Office Visit  Subjective   Patient ID: [name], female    DOB: 03/02/1956  Age: 69 y.o. MRN: [id number]  Chief Complaint  Patient presents with   Medical Management of Chronic Issues     History of Present Illness   [name] is a 69 year old female who presents with persistent neck pain and recurrent skin rash.  She has had neck pain for several years after lifting a motorcycle, described as catching or pinching in the neck with radiation to the head. Turning her head to the left is very painful and sometimes causes a burning sensation in the left ear. Changing sleeping positions has not helped. A prednisone pack gave temporary relief but the pain returned. She denies pain radiating down the arm.  She has chronic recurrent rash under the breasts and in skin folds, with small bumps that improve with antifungal cream but quickly recur. She applies the cream twice daily but finds this inadequate.   She reports significant leg swelling while taking amlodipine , along with bloating and decreased urination. She previously tried another blood pressure medication but stopped it due to feeling unwell.  She had a UTI in May and  December that resolved after antibiotics.  She is not taking telmisartan  or benazepril  per her updated medication list.          The 10-year ASCVD risk score (Arnett DK, et al., 2019) is: 16.7%  Health Maintenance Due  Topic Date Due   Medicare Annual Wellness (AWV)  Never done   Pneumococcal Vaccine: 50+ Years (1 of 2 - PCV) Never done   Zoster Vaccines- Shingrix (1 of 2) Never done   Bone Density Scan  Never done   COVID-19 Vaccine (1 - 2025-26 season) Never done      Objective:     BP (!) 149/77   Pulse 72   Ht 5' 1 (1.549 m)   Wt 149 lb (67.6 kg)   SpO2 97%   BMI 28.15 kg/m    Physical Exam   SKIN: mild Rash under breasts with small, non-erythematous bumps  NECK: Pain on palpation of cervical spine with limited  range of motion to the left.        No results found for any visits on 04/18/24.      Assessment & Plan:   Cervicalgia Assessment & Plan: Chronic neck pain with limited range of motion, possibly due to pinched nerve. Previous imaging normal. - Ordered cervical spine x-ray. - Consider referral to orthopedics if x-ray indicates chronic arthritis or disc issues. - Consider physical therapy based on x-ray results.  Orders: -     DG Cervical Spine Complete; Future  Essential hypertension Assessment & Plan: Continue metoprolol . Starting indapamide .  Not taking amlodipine  or enalapril-hydrochlorothiazide  any longer.    Seborrheic keratosis Assessment & Plan: Benign seborrheic keratosis on face, no biopsy needed. - Schedule cryotherapy for removal when liquid nitrogen is available.   Leg swelling Assessment & Plan: Persistent edema, continued after stopping amlodipine  use. - Initiated indapamide  monotherapy.       Intertrigo Assessment & Plan: Chronic candidiasis under breasts, persistent despite antifungal cream. Recurrence likely due to skin fold environment. - Continue antifungal cream twice daily. - Apply moisturizer or Desitin cream between antifungal applications. - Consider oral antifungal if no improvement.    Other orders -     Indapamide ; Take 1 tablet (1.25 mg total) by mouth daily.  Dispense: 30 tablet; Refill: 2 -  Flu vaccine HIGH DOSE PF(Fluzone Trivalent)    Return in about 6 weeks (around 05/30/2024) for bp, rash, .    Wendy MARLA Slain, MD  "

## 2024-04-18 NOTE — Patient Instructions (Signed)
" °  VISIT SUMMARY: During your visit, we addressed your persistent neck pain, recurrent skin rash, and leg swelling. We also discussed a benign skin growth on your face.  YOUR PLAN: CANDIDIASIS OF SKIN FOLDS (INTERTRIGO): You have a chronic fungal infection under your breasts and in skin folds that persists despite using antifungal cream. -Continue using the antifungal cream twice daily. -Apply moisturizer or Desitin cream between antifungal applications. -If there is no improvement, we may consider an oral antifungal medication.  CERVICALGIA: You have chronic neck pain with limited range of motion, possibly due to a pinched nerve. -A cervical spine x-ray has been ordered. -Based on the x-ray results, we may refer you to orthopedics if there are signs of chronic arthritis or disc issues. -Physical therapy may be considered depending on the x-ray results.  SEBORRHEIC KERATOSIS OF FACE: You have a benign skin growth on your face that does not require a biopsy. -We will schedule cryotherapy for removal when liquid nitrogen is available.  LOWER EXTREMITY EDEMA: You have persistent leg swelling, possibly related to your use of amlodipine . -We have discontinued amlodipine . -You will start taking hydrochlorothiazide  as a single therapy.    "

## 2024-04-20 DIAGNOSIS — M542 Cervicalgia: Secondary | ICD-10-CM | POA: Insufficient documentation

## 2024-04-20 DIAGNOSIS — L821 Other seborrheic keratosis: Secondary | ICD-10-CM | POA: Insufficient documentation

## 2024-04-20 NOTE — Assessment & Plan Note (Signed)
 Chronic candidiasis under breasts, persistent despite antifungal cream. Recurrence likely due to skin fold environment. - Continue antifungal cream twice daily. - Apply moisturizer or Desitin cream between antifungal applications. - Consider oral antifungal if no improvement.

## 2024-04-20 NOTE — Assessment & Plan Note (Signed)
 Benign seborrheic keratosis on face, no biopsy needed. - Schedule cryotherapy for removal when liquid nitrogen is available.

## 2024-04-20 NOTE — Assessment & Plan Note (Signed)
 Persistent edema, continued after stopping amlodipine  use. - Initiated indapamide  monotherapy.

## 2024-04-20 NOTE — Assessment & Plan Note (Signed)
 Continue metoprolol . Starting indapamide .  Not taking amlodipine  or enalapril-hydrochlorothiazide  any longer.

## 2024-04-20 NOTE — Assessment & Plan Note (Signed)
 Chronic neck pain with limited range of motion, possibly due to pinched nerve. Previous imaging normal. - Ordered cervical spine x-ray. - Consider referral to orthopedics if x-ray indicates chronic arthritis or disc issues. - Consider physical therapy based on x-ray results.

## 2024-05-30 ENCOUNTER — Ambulatory Visit: Admitting: Family Medicine
